# Patient Record
Sex: Male | Born: 1955 | Race: White | Hispanic: No | Marital: Single | Smoking: Never smoker
Health system: Southern US, Community
[De-identification: ages and names within clinical notes are randomized; demographics above are authoritative.]

## PROBLEM LIST (undated history)

## (undated) DIAGNOSIS — K5730 Diverticulosis of large intestine without perforation or abscess without bleeding: Secondary | ICD-10-CM

## (undated) DIAGNOSIS — I10 Essential (primary) hypertension: Secondary | ICD-10-CM

## (undated) DIAGNOSIS — E7800 Pure hypercholesterolemia, unspecified: Secondary | ICD-10-CM

## (undated) HISTORY — PX: ABDOMINAL SURGERY: SHX537

---

## 2004-05-25 ENCOUNTER — Inpatient Hospital Stay (HOSPITAL_COMMUNITY): Admission: EM | Admit: 2004-05-25 | Discharge: 2004-06-03 | Payer: Self-pay | Admitting: Emergency Medicine

## 2004-08-07 ENCOUNTER — Encounter: Admission: RE | Admit: 2004-08-07 | Discharge: 2004-08-07 | Payer: Self-pay | Admitting: General Surgery

## 2004-09-25 ENCOUNTER — Inpatient Hospital Stay (HOSPITAL_COMMUNITY): Admission: RE | Admit: 2004-09-25 | Discharge: 2004-09-30 | Payer: Self-pay | Admitting: General Surgery

## 2015-06-17 ENCOUNTER — Encounter (HOSPITAL_COMMUNITY): Payer: Self-pay | Admitting: Emergency Medicine

## 2015-06-17 ENCOUNTER — Emergency Department (HOSPITAL_COMMUNITY): Payer: BLUE CROSS/BLUE SHIELD

## 2015-06-17 ENCOUNTER — Observation Stay (HOSPITAL_COMMUNITY)
Admission: EM | Admit: 2015-06-17 | Discharge: 2015-06-18 | Disposition: A | Discharge status: Home / Self Care | Payer: BLUE CROSS/BLUE SHIELD | Attending: Internal Medicine | Admitting: Internal Medicine

## 2015-06-17 DIAGNOSIS — R739 Hyperglycemia, unspecified: Secondary | ICD-10-CM | POA: Diagnosis present

## 2015-06-17 DIAGNOSIS — W19XXXA Unspecified fall, initial encounter: Secondary | ICD-10-CM | POA: Diagnosis not present

## 2015-06-17 DIAGNOSIS — R42 Dizziness and giddiness: Secondary | ICD-10-CM | POA: Diagnosis present

## 2015-06-17 DIAGNOSIS — Z880 Allergy status to penicillin: Secondary | ICD-10-CM | POA: Insufficient documentation

## 2015-06-17 DIAGNOSIS — K5730 Diverticulosis of large intestine without perforation or abscess without bleeding: Secondary | ICD-10-CM | POA: Insufficient documentation

## 2015-06-17 DIAGNOSIS — S5001XA Contusion of right elbow, initial encounter: Secondary | ICD-10-CM | POA: Diagnosis not present

## 2015-06-17 DIAGNOSIS — E785 Hyperlipidemia, unspecified: Secondary | ICD-10-CM | POA: Diagnosis not present

## 2015-06-17 DIAGNOSIS — S0003XA Contusion of scalp, initial encounter: Secondary | ICD-10-CM

## 2015-06-17 DIAGNOSIS — I10 Essential (primary) hypertension: Secondary | ICD-10-CM | POA: Diagnosis present

## 2015-06-17 DIAGNOSIS — Z885 Allergy status to narcotic agent status: Secondary | ICD-10-CM | POA: Insufficient documentation

## 2015-06-17 DIAGNOSIS — R55 Syncope and collapse: Principal | ICD-10-CM | POA: Diagnosis present

## 2015-06-17 DIAGNOSIS — G4733 Obstructive sleep apnea (adult) (pediatric): Secondary | ICD-10-CM | POA: Diagnosis not present

## 2015-06-17 DIAGNOSIS — K219 Gastro-esophageal reflux disease without esophagitis: Secondary | ICD-10-CM | POA: Diagnosis present

## 2015-06-17 DIAGNOSIS — Z9989 Dependence on other enabling machines and devices: Secondary | ICD-10-CM

## 2015-06-17 HISTORY — DX: Essential (primary) hypertension: I10

## 2015-06-17 HISTORY — DX: Pure hypercholesterolemia, unspecified: E78.00

## 2015-06-17 HISTORY — DX: Diverticulosis of large intestine without perforation or abscess without bleeding: K57.30

## 2015-06-17 LAB — COMPREHENSIVE METABOLIC PANEL
ALT: 25 U/L (ref 17–63)
AST: 27 U/L (ref 15–41)
Albumin: 4.1 g/dL (ref 3.5–5.0)
Alkaline Phosphatase: 74 U/L (ref 38–126)
Anion gap: 12 (ref 5–15)
BUN: 21 mg/dL — ABNORMAL HIGH (ref 6–20)
CO2: 22 mmol/L (ref 22–32)
Calcium: 9 mg/dL (ref 8.9–10.3)
Chloride: 103 mmol/L (ref 101–111)
Creatinine, Ser: 1.2 mg/dL (ref 0.61–1.24)
GFR calc Af Amer: >60 mL/min (ref >60)
GFR calc non Af Amer: >60 mL/min (ref >60)
Glucose, Bld: 171 mg/dL — ABNORMAL HIGH (ref 65–99)
Potassium: 3.5 mmol/L (ref 3.5–5.1)
Sodium: 137 mmol/L (ref 135–145)
Total Bilirubin: 0.8 mg/dL (ref 0.3–1.2)
Total Protein: 6.4 g/dL — ABNORMAL LOW (ref 6.5–8.1)

## 2015-06-17 LAB — RAPID URINE DRUG SCREEN, HOSP PERFORMED
Amphetamines: NOT DETECTED
Barbiturates: NOT DETECTED
Benzodiazepines: NOT DETECTED
Cocaine: NOT DETECTED
Opiates: NOT DETECTED
Tetrahydrocannabinol: NOT DETECTED

## 2015-06-17 LAB — CBC WITH DIFFERENTIAL/PLATELET
Basophils Absolute: 0.1 10*3/uL (ref 0.0–0.1)
Basophils Relative: 1 % (ref 0–1)
Eosinophils Absolute: 0.4 10*3/uL (ref 0.0–0.7)
Eosinophils Relative: 4 % (ref 0–5)
HCT: 44.2 % (ref 39.0–52.0)
Hemoglobin: 15.2 g/dL (ref 13.0–17.0)
Lymphocytes Relative: 30 % (ref 12–46)
Lymphs Abs: 3 10*3/uL (ref 0.7–4.0)
MCH: 31.3 pg (ref 26.0–34.0)
MCHC: 34.4 g/dL (ref 30.0–36.0)
MCV: 90.9 fL (ref 78.0–100.0)
Monocytes Absolute: 0.7 10*3/uL (ref 0.1–1.0)
Monocytes Relative: 7 % (ref 3–12)
Neutro Abs: 5.9 10*3/uL (ref 1.7–7.7)
Neutrophils Relative %: 58 % (ref 43–77)
Platelets: 253 10*3/uL (ref 150–400)
RBC: 4.86 MIL/uL (ref 4.22–5.81)
RDW: 13.5 % (ref 11.5–15.5)
WBC: 10.1 10*3/uL (ref 4.0–10.5)

## 2015-06-17 LAB — I-STAT TROPONIN, ED: Troponin i, poc: 0 ng/mL (ref 0.00–0.08)

## 2015-06-17 LAB — GLUCOSE, CAPILLARY
Glucose-Capillary: 116 mg/dL — ABNORMAL HIGH (ref 65–99)
Glucose-Capillary: 111 mg/dL — ABNORMAL HIGH (ref 65–99)

## 2015-06-17 LAB — ETHANOL: Alcohol, Ethyl (B): <5 mg/dL (ref <5)

## 2015-06-17 MED ORDER — PROMETHAZINE HCL 25 MG PO TABS: 12.5000 mg | ORAL_TABLET | Freq: Four times a day (QID) | ORAL | Status: DC | PRN

## 2015-06-17 MED ORDER — SODIUM CHLORIDE 0.9 % IV SOLN
INTRAVENOUS | Status: AC
  Administered 2015-06-17: 13:00:00 via INTRAVENOUS

## 2015-06-17 MED ORDER — ASPIRIN EC 81 MG PO TBEC
81.0000 mg | DELAYED_RELEASE_TABLET | Freq: Every day | ORAL | Status: DC
  Administered 2015-06-17 – 2015-06-18 (×2): 81 mg via ORAL
  Filled 2015-06-17 (×2): qty 1

## 2015-06-17 MED ORDER — LISINOPRIL 20 MG PO TABS
20.0000 mg | ORAL_TABLET | Freq: Every day | ORAL | Status: DC
  Administered 2015-06-17 – 2015-06-18 (×2): 20 mg via ORAL
  Filled 2015-06-17 (×2): qty 1

## 2015-06-17 MED ORDER — NAPROXEN 500 MG PO TABS
500.0000 mg | ORAL_TABLET | Freq: Two times a day (BID) | ORAL | Status: DC | PRN
  Administered 2015-06-18: 500 mg via ORAL
  Filled 2015-06-17 (×3): qty 1

## 2015-06-17 MED ORDER — ENOXAPARIN SODIUM 40 MG/0.4ML ~~LOC~~ SOLN
40.0000 mg | SUBCUTANEOUS | Status: DC
  Administered 2015-06-17: 40 mg via SUBCUTANEOUS
  Filled 2015-06-17: qty 0.4

## 2015-06-17 MED ORDER — ATORVASTATIN CALCIUM 80 MG PO TABS
80.0000 mg | ORAL_TABLET | Freq: Every day | ORAL | Status: DC
  Administered 2015-06-17: 80 mg via ORAL
  Filled 2015-06-17: qty 1

## 2015-06-17 MED ORDER — PANTOPRAZOLE SODIUM 40 MG PO TBEC
40.0000 mg | DELAYED_RELEASE_TABLET | Freq: Every day | ORAL | Status: DC | PRN
  Administered 2015-06-18: 40 mg via ORAL
  Filled 2015-06-17: qty 1

## 2015-06-17 NOTE — Evaluation (Signed)
Physical Therapy Evaluation Patient Details Name: Nathan Acevedo MRN: [id number] DOB: 05-19-1956 Today's Date: 06/17/2015   History of Present Illness  Patient is a 59 yo male admitted 06/17/15 following syncope/collapse at work.  Patient hit head and elbow, and had LOC.   PMH:  HTN, OSA on CPAP, chronic back pain, h/o "dizziness" per patient  Clinical Impression  Patient presents with problems listed below.  Will benefit from acute PT to maximize functional independence prior to discharge.  Patient with symptoms of dizziness/nausea with position changes.  Initiated vestibular evaluation - patient requested to stop due to nausea.  Will return in am to continue assessment.  Recommend OP PT for Vestibular Rehab at discharge.    Follow Up Recommendations Outpatient PT (for Vestibular Rehab); Supervision - Intermittent    Equipment Recommendations  None recommended by PT    Recommendations for Other Services       Precautions / Restrictions Precautions Precautions: None Precaution Comments: dizziness Restrictions Weight Bearing Restrictions: No      Mobility  Bed Mobility Overal bed mobility: Modified Independent             General bed mobility comments: Increased time  Transfers Overall transfer level: Needs assistance Equipment used: None Transfers: Sit to/from Stand Sit to Stand: Supervision         General transfer comment: Supervision for safety only.  Ambulation/Gait Ambulation/Gait assistance: Supervision Ambulation Distance (Feet): 30 Feet Assistive device: None Gait Pattern/deviations: Step-through pattern     General Gait Details: Patient with good gait pattern.  Gait is slow and guarded.  Stairs            Wheelchair Mobility    Modified Rankin (Stroke Patients Only)       Balance Overall balance assessment: No apparent balance deficits (not formally assessed)                                           Pertinent  Vitals/Pain Pain Assessment: 0-10 Pain Score: 3  Pain Location: Rt chest wall Pain Descriptors / Indicators: Sore Pain Intervention(s): Monitored during session;Repositioned    Home Living Family/patient expects to be discharged to:: Private residence Living Arrangements: Alone Available Help at Discharge: Family;Available 24 hours/day (Patient could go to mother's home) Type of Home: House Home Access: Stairs to enter Entrance Stairs-Rails: Doctor, general practice of Steps: 3 Home Layout: Two level;Bed/bath upstairs Home Equipment: None      Prior Function Level of Independence: Independent         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  Performed Lt Dix-Hallpike - no symptoms.  On return to sitting, patient with 8/10 symptoms of dizziness and nausea with loss of balance.  Patient declined further testing due to nausea.    Exercises  Assessment/Plan    PT Assessment Patient needs continued PT services  PT Diagnosis Acute pain;Other (comment) (Dizziness)   PT Problem List Decreased activity tolerance;Decreased balance;Decreased mobility;Pain (Dizziness)  PT Treatment Interventions Gait training;Stair training;Functional mobility training;Therapeutic activities;Therapeutic exercise;Patient/family education (Vestibular Rehab)   PT Goals (Current goals can be found in the Care Plan section) Acute Rehab PT Goals Patient  Stated Goal: To go home soon PT Goal Formulation: With patient Time For Goal Achievement: 06/24/15 Potential to Achieve Goals: Good    Frequency Min 4X/week   Barriers to discharge Decreased caregiver support Patient lives alone.  Can go to mother's home at d/c for 24 hour assist.    Co-evaluation               End of Session   Activity Tolerance: Other (comment) (Limited by dizziness and nausea) Patient left: in bed;with call bell/phone within reach;with bed alarm set;with family/visitor present Nurse Communication: Mobility status (Vestibular testing initiated)    Functional Assessment Tool Used: Clinical judgement Functional Limitation: Mobility: Walking and moving around Mobility: Walking and Moving Around Current Status (W0981): At least 1 percent but less than 20 percent impaired, limited or restricted Mobility: Walking and Moving Around Goal Status [phone]): 0 percent impaired, limited or restricted    Time: 1640-1705 PT Time Calculation (min) (ACUTE ONLY): 25 min   Charges:   PT Evaluation $Initial PT Evaluation Tier I: 1 Procedure PT Treatments $Therapeutic Activity: 8-22 mins   PT G Codes:   PT G-Codes **NOT FOR INPATIENT CLASS** Functional Assessment Tool Used: Clinical judgement Functional Limitation: Mobility: Walking and moving around Mobility: Walking and Moving Around Current Status (W2956): At least 1 percent but less than 20 percent impaired, limited or restricted Mobility: Walking and Moving Around Goal Status [phone]): 0 percent impaired, limited or restricted    Vena Austria 06/17/2015, 5:28 PM Durenda Hurt. Renaldo Fiddler, [facility] Acute Rehab Services Pager [phone]

## 2015-06-17 NOTE — H&P (Signed)
Date: 06/17/2015               Patient Name:  Nathan Acevedo MRN: [id number]  DOB: 1955/11/18 Age / Sex: 59 y.o., male   PCP: Vicente Males, PA           Medical Service: Internal Medicine Teaching Service         Attending Physician: Dr. Tyson Alias, MD    First Contact: Dr. Allena Katz, MD Pager: [phone]-  Second Contact: Dr. Carlynn Purl, MD Pager: [phone] (7AM-5PM Mon-Fri)       After Hours (After 5p/  First Contact Pager: [phone]  weekends / holidays): Second Contact Pager: [phone]    Most Recent Discharge Date:  09/30/04  Chief Complaint:  Chief Complaint  Patient presents with  . Loss of Consciousness       History of Present Illness:  Nathan Acevedo is a 59 y.o. male who has a PMH of  HTN, dyslipidemia, GERD, chronic back pain, OSA (wears CPAP), and diverticulitis with prior diverting colostomy (2005), presents to ED with a syncopal episode.  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  About 7:15AM he was walking across the floor at his work when he suddenly began feeling dizzy which he describes as the room swaying back and forth.  He also endorses some diaphoresis (also reports being hot in his work area) and says he felt sleepy.  He denies any CP, SOB, or nausea/vomiting.  He remembers feeling he may need to sit down and then hit the ground striking the back of his head and his right elbow.  The next thing he remembers is waking up to everyone holding him down telling him not to get up.  He reports "being out" for maybe a couple of minutes.  He then states he felt some SOB and slight nausea when he came around but thinks it is due to the other employees holding him down.  Denies any loss of bowel/bladder control, seizure activity, tongue biting or confusion.  Denies any h/o seizure disorder, illicit drug/ETOH use.  No personal or FH of CAD or CVA.  Reports that he does feel dizzy with head movement.  Has experienced  similar episodes of dizziness since he was a child but never syncope.  Also states he has anxiety attacks about twice per year and that this feels similar to his previous attacks minus the syncope.  He reports having some difficulty with his supervisor at work and has felt somewhat anxious about that.  Denies any focal weakness.    ED course: In the ED, CT head was neg for acute findings but noted mild atrophy and periventricular white matter decreased attenuation likely due to chronic small vessel ischemic changes.  Noted focal scalp swelling in the left posterior parietal region.  CXR without acute findings. XR if R elbow revealed mild degenerative change without acute abnormality and soft tissue swelling over the olecranon c/w recent injury.  He was also borderline orthostatic.  EKG NSR.  I-stat troponin 0.00.  Serum glucose elevated at 171 and BUN slightly elevated at 21.    Meds: No current facility-administered medications for this encounter.   Current Outpatient Prescriptions  Medication Sig Dispense Refill  . lisinopril-hydrochlorothiazide (PRINZIDE,ZESTORETIC) 20-25 MG per tablet Take 1 tablet by mouth daily.    . naproxen (NAPROSYN) 500 MG tablet Take 500 mg by mouth 2 (two) times daily with a meal.    .  omeprazole (PRILOSEC) 20 MG capsule Take 20 mg by mouth daily as needed (heartburn).       (Not in a hospital admission)  Allergies: Allergies as of 06/17/2015 - Review Complete 06/17/2015  Allergen Reaction Noted  . Oxycodone  06/17/2015  . Penicillins Rash 06/17/2015    PMH: Past Medical History  Diagnosis Date  . Diverticula of intestine   . High cholesterol   . Hypertension     PSH: Past Surgical History  Procedure Laterality Date  . Abdominal surgery      FH: No family history on file.  SH: Social History  Substance Use Topics  . Smoking status: Never Smoker   . Smokeless tobacco: Not on file  . Alcohol Use: No    Review of Systems: Pertinent items  are noted in HPI.  Physical Exam: BP 137/93 mmHg  Pulse 88  Temp(Src) 98 F (36.7 C) (Oral)  Resp 21  SpO2 94%  Physical Exam  Constitutional: Vital signs reviewed.  Patient is well-developed and well-nourished male in no acute distress and cooperative with exam.  Head: Normocephalic with slight swelling to the posterior scalp without any obvious abrasions.   Eyes: PERRL, EOMI, conjunctivae normal, No scleral icterus.  Neck: Supple, Trachea midline .  Cardiovascular: RRR, S1 normal, S2 normal, no MRG, pulses symmetric and intact bilaterally. Pulmonary/Chest: normal respiratory effort, CTAB, no wheezes, rales, or rhonchi Abdominal: Soft. Non-tender, non-distended, bowel sounds are normal.  Midline surgical scar and left-sided scar from prior colostomy.   Neurological: A&O x3, cranial nerve II-XII are intact, no focal motor deficits, moving all extremities.  UE and LE strength 5/5.  Skin: Warm, dry and intact. No rash, cyanosis, or clubbing.  Psychiatric: Normal mood and affect.    Lab results:  Basic Metabolic Panel:  Recent Labs  16/10/96 0841  NA 137  K 3.5  CL 103  CO2 22  GLUCOSE 171*  BUN 21*  CREATININE 1.20  CALCIUM 9.0    Calcium/Magnesium/Phosphorus:  Recent Labs Lab 06/17/15 0841  CALCIUM 9.0    Liver Function Tests:  Recent Labs  06/17/15 0841  AST 27  ALT 25  ALKPHOS 74  BILITOT 0.8  PROT 6.4*  ALBUMIN 4.1   No results for input(s): LIPASE, AMYLASE in the last 72 hours. No results for input(s): AMMONIA in the last 72 hours.  CBC: Lab Results  Component Value Date   WBC 10.1 06/17/2015   HGB 15.2 06/17/2015   HCT 44.2 06/17/2015   MCV 90.9 06/17/2015   PLT 253 06/17/2015   Cardiac Enzymes:  Recent Labs  06/17/15 0833  TROPIPOC 0.00   No results found for: CKTOTAL, CKMB, CKMBINDEX, TROPONINI  Urine Drug Screen: Drugs of Abuse:  No results found for: LABOPIA, COCAINSCRNUR, LABBENZ, AMPHETMU, THCU, LABBARB  Alcohol  Level:  Recent Labs  06/17/15 0842  ETH <5    Urinalysis: No results found for: COLORURINE, APPEARANCEUR, LABSPEC, PHURINE, GLUCOSEU, HGBUR, BILIRUBINUR, KETONESUR, PROTEINUR, UROBILINOGEN, NITRITE, LEUKOCYTESUR  Imaging results:  Dg Chest 2 View  06/17/2015   CLINICAL DATA:  Fall at work with chest pain, initial encounter  EXAM: CHEST - 2 VIEW  COMPARISON:  None.  FINDINGS: The heart size and mediastinal contours are within normal limits. Both lungs are clear. The visualized skeletal structures are unremarkable.  IMPRESSION: No active disease.   Electronically Signed   By: Alcide Clever M.D.   On: 06/17/2015 09:05   Dg Elbow Complete Right  06/17/2015   CLINICAL DATA:  Fall at  work with elbow pain, initial encounter  EXAM: RIGHT ELBOW - COMPLETE 3+ VIEW  COMPARISON:  None.  FINDINGS: Mild degenerative changes are noted in the elbow. No acute fracture or dislocation is seen. Soft tissue prominence is noted over the olecranon consistent with the recent injury. No joint effusion is seen.  IMPRESSION: Mild degenerative change without acute bony abnormality.  Soft tissue swelling over the olecranon consistent with the recent injury.   Electronically Signed   By: Alcide Clever M.D.   On: 06/17/2015 09:04   Ct Head Wo Contrast  06/17/2015   CLINICAL DATA:  Dizziness this morning at work, fall  EXAM: CT HEAD WITHOUT CONTRAST  TECHNIQUE: Contiguous axial images were obtained from the base of the skull through the vertex without intravenous contrast.  COMPARISON:  None.  FINDINGS: No skull fracture is noted. There is scalp swelling in left posterior parietal region. No intracranial hemorrhage, mass effect or midline shift. Mild cerebral atrophy. Mild periventricular white matter decreased attenuation probable due to chronic small vessel ischemic changes. No definite acute cortical infarction. No mass lesion is noted on this unenhanced scan.  IMPRESSION: No acute intracranial abnormality. Mild cerebral  atrophy. Mild periventricular white matter decreased attenuation probable due to chronic small vessel ischemic changes. Focal scalp swelling in left posterior parietal region.   Electronically Signed   By: Natasha Mead M.D.   On: 06/17/2015 09:25    EKG: EKG Interpretation  Date/Time:  Friday June 17 2015 08:03:12 EDT Ventricular Rate:  99 PR Interval:  164 QRS Duration: 96 QT Interval:  359 QTC Calculation: 461 R Axis:   81 Text Interpretation:  Sinus rhythm Confirmed by Rubin Payor  MD, Harrold Donath [phone]) on 06/17/2015 8:07:02 AM   Antibiotics: Antibiotics Given (last 72 hours)    None      Anti-infectives    None     Consults:     Assessment & Plan by Problem: Principal Problem:   Syncope and collapse Active Problems:   Hypertension   Dyslipidemia   Hyperglycemia   GERD (gastroesophageal reflux disease)   OSA on CPAP  Pt is a 26 YOM with PMH of HTN, dyslipidemia, GERD, chronic back pain admitted for syncope.    Syncope  Pt experienced syncope while walking at work.  Has experienced previous episodes of presyncope but without syncope.  No h/o seizures, no seizure like activity,  tongue biting, loss of bowel bladder control.  No cardiac history and without CP and normal EKG and troponin.  Likely vasovagal syncope.  Endorses prodrome of dizziness, diaphoresis.  He was borderline orthostatic with BP lying 131/91 HR 91, sitting 145/88 HR 99, standing 149/90 HR 101. Likely anxiety may also be contributing given that anxiety attacks in the past have felt similar.   -admit to telemetry under observation status -repeat orthostatics in AM  -NS@100ml /h  -PT eval  -UDS pending   Hypertension  Stable, on lisinopril HCTZ at home. -cont lisinopril 20mg  but will hold HCTZ for now given orthostasis   Dyslipidemia Reports recently started on a statin, possibly crestor? -atorvastatin 80mg    Hyperglycemia Reports being checked for DM in the recent past.  Attempted to obtain records  from primary physician but was asking for a release of records to be faxed over. -check HA1c -monitor cbgs   GERD  Pt on PPI at home. -protonix PRN   OSA -cont CPAP qhs  FEN  Fluids-NS 179ml/h x 10 hr Electrolytes-Replete PRN as necessary  Nutrition-Carb mod/HH    VTE  prophylaxis  lovenox 40mg  SQ qd  Disposition Disposition deferred at this time, awaiting improvement of current medical problems. Anticipated discharge in approximately 1-2 day(s).  Obtain records from PCP.    Emergency Contact Contact Information    Name Relation Home Work Mobile   Louin Relative [phone]        The patient does have a current PCP (No primary care provider on file, PCP: Vicente Males, PA) and does need an [facility] hospital follow-up appointment after discharge.  Signed Marrian Salvage, MD PGY-3, Internal Medicine Teaching Service 06/17/2015, 12:31 PM

## 2015-06-17 NOTE — ED Provider Notes (Signed)
CSN: [id number]     Arrival date & time 06/17/15  0757 History   First MD Initiated Contact with Patient 06/17/15 0800     Chief Complaint  Patient presents with  . Loss of Consciousness     (Consider location/radiation/quality/duration/timing/severity/associated sxs/prior Treatment) Patient is a 59 y.o. Acevedo presenting with syncope.  Loss of Consciousness Associated symptoms: nausea   Associated symptoms: no chest pain, no headaches, no shortness of breath, no vomiting and no weakness    patient states he was at work when began to feel lightheaded or the room spinning. He states he then woke up on the floor. Complaining some pain in his elbow and states he has had to. At this point he is nauseous and states he feels as if the room is sort of waving back and forth. No chest pain. No trouble breathing. States he felt a little tired the last couple days but otherwise fine. Patient said he is on anticoagulation but it sounds more antihypertensives. No abdominal pain. No shortness of breath. No confusion.  Past Medical History  Diagnosis Date  . Diverticula of intestine   . High cholesterol   . Hypertension    Past Surgical History  Procedure Laterality Date  . Abdominal surgery     History reviewed. No pertinent family history. Social History  Substance Use Topics  . Smoking status: Never Smoker   . Smokeless tobacco: None  . Alcohol Use: No    Review of Systems  Constitutional: Negative for activity change and appetite change.  Eyes: Negative for pain.  Respiratory: Negative for chest tightness and shortness of breath.   Cardiovascular: Positive for syncope. Negative for chest pain and leg swelling.  Gastrointestinal: Positive for nausea. Negative for vomiting, abdominal pain and diarrhea.  Genitourinary: Negative for flank pain.  Musculoskeletal: Negative for back pain and neck stiffness.  Skin: Negative for rash.  Neurological: Positive for syncope. Negative for weakness,  numbness and headaches.  Psychiatric/Behavioral: Negative for behavioral problems.      Allergies  Oxycodone and Penicillins  Home Medications   Prior to Admission medications   Medication Sig Start Date End Date Taking? Authorizing Provider  lisinopril-hydrochlorothiazide (PRINZIDE,ZESTORETIC) 20-25 MG per tablet Take 1 tablet by mouth daily.   Yes Historical Provider, MD  naproxen (NAPROSYN) 500 MG tablet Take 500 mg by mouth 2 (two) times daily with a meal.   Yes Historical Provider, MD  omeprazole (PRILOSEC) 20 MG capsule Take 20 mg by mouth daily as needed (heartburn).   Yes Historical Provider, MD   BP 137/93 mmHg  Pulse 88  Temp(Src) 97.9 F (36.6 C) (Oral)  Resp 20  Ht 5\' 6"  (1.676 m)  Wt 182 lb 12.8 oz (82.918 kg)  BMI 29.52 kg/m2  SpO2 98% Physical Exam  Constitutional: He is oriented to person, place, and time. He appears well-developed and well-nourished.  HENT:  Hematoma to left occipital area.  Cardiovascular: Normal rate and regular rhythm.   Pulmonary/Chest: Effort normal and breath sounds normal.  Abdominal: Soft. There is no tenderness.  Musculoskeletal: Normal range of motion. He exhibits tenderness.  Swelling over olecranon area of right elbow. Neurovascularly intact over right hand.  Neurological: He is alert and oriented to person, place, and time. No cranial nerve deficit. Coordination normal.  Finger-nose grossly intact heel-to-shin intact bilaterally.  Skin: Skin is warm.    ED Course  Procedures (including critical care time) Labs Review Labs Reviewed  COMPREHENSIVE METABOLIC PANEL - Abnormal; Notable for the following:  Glucose, Bld 171 (*)    BUN 21 (*)    Total Protein 6.4 (*)    All other components within normal limits  CBC WITH DIFFERENTIAL/PLATELET  ETHANOL  URINE RAPID DRUG SCREEN, HOSP PERFORMED  HEMOGLOBIN A1C  I-STAT TROPOININ, ED    Imaging Review Dg Chest 2 View  06/17/2015   CLINICAL DATA:  Fall at work with chest  pain, initial encounter  EXAM: CHEST - 2 VIEW  COMPARISON:  None.  FINDINGS: The heart size and mediastinal contours are within normal limits. Both lungs are clear. The visualized skeletal structures are unremarkable.  IMPRESSION: No active disease.   Electronically Signed   By: Alcide Clever M.D.   On: 06/17/2015 09:05   Dg Elbow Complete Right  06/17/2015   CLINICAL DATA:  Fall at work with elbow pain, initial encounter  EXAM: RIGHT ELBOW - COMPLETE 3+ VIEW  COMPARISON:  None.  FINDINGS: Mild degenerative changes are noted in the elbow. No acute fracture or dislocation is seen. Soft tissue prominence is noted over the olecranon consistent with the recent injury. No joint effusion is seen.  IMPRESSION: Mild degenerative change without acute bony abnormality.  Soft tissue swelling over the olecranon consistent with the recent injury.   Electronically Signed   By: Alcide Clever M.D.   On: 06/17/2015 09:04   Ct Head Wo Contrast  06/17/2015   CLINICAL DATA:  Dizziness this morning at work, fall  EXAM: CT HEAD WITHOUT CONTRAST  TECHNIQUE: Contiguous axial images were obtained from the base of the skull through the vertex without intravenous contrast.  COMPARISON:  None.  FINDINGS: No skull fracture is noted. There is scalp swelling in left posterior parietal region. No intracranial hemorrhage, mass effect or midline shift. Mild cerebral atrophy. Mild periventricular white matter decreased attenuation probable due to chronic small vessel ischemic changes. No definite acute cortical infarction. No mass lesion is noted on this unenhanced scan.  IMPRESSION: No acute intracranial abnormality. Mild cerebral atrophy. Mild periventricular white matter decreased attenuation probable due to chronic small vessel ischemic changes. Focal scalp swelling in left posterior parietal region.   Electronically Signed   By: Natasha Mead M.D.   On: 06/17/2015 09:25   I have personally reviewed and evaluated these images and lab results  as part of my medical decision-making.   EKG Interpretation   Date/Time:  Friday June 17 2015 08:03:12 EDT Ventricular Rate:  99 PR Interval:  164 QRS Duration: 96 QT Interval:  359 QTC Calculation: 461 R Axis:   81 Text Interpretation:  Sinus rhythm Confirmed by Rubin Payor  MD, Dory Demont  [phone]) on 06/17/2015 8:07:02 AM      MDM   Final diagnoses:  Syncope, unspecified syncope type  Vertigo  Elbow contusion, right, initial encounter  Scalp hematoma, initial encounter    Patient with syncope. Had also episode of vertigo. Unsure if the syncope was from hitting his head or was the cause of the fall. Head CT reassuring. Will admit to internal medicine.    Benjiman Core, MD 06/17/15 [phone]

## 2015-06-17 NOTE — ED Notes (Signed)
Pt reports blood thinners , but no blood thinners found in daily medication.

## 2015-06-17 NOTE — Progress Notes (Signed)
  Pt admitted to the unit. Pt is stable, alert and oriented per baseline. Oriented to room, staff, and call bell. Educated to call for any assistance. Bed in lowest position, call bell within reach- will continue to monitor. 

## 2015-06-17 NOTE — ED Notes (Signed)
Per EMS pt was at work walking when he had syncopal episode. Pt fell to ground and hit back of head and rt elbow. Pt is on blood thinners. Pt alert x4. NAD at this time. Pt does report dizziness and one episode of vomiting in route.

## 2015-06-18 DIAGNOSIS — R55 Syncope and collapse: Secondary | ICD-10-CM | POA: Insufficient documentation

## 2015-06-18 DIAGNOSIS — R42 Dizziness and giddiness: Secondary | ICD-10-CM | POA: Insufficient documentation

## 2015-06-18 LAB — HEMOGLOBIN A1C
HEMOGLOBIN A1C: 5.9 % — AB (ref 4.8–5.6)
MEAN PLASMA GLUCOSE: 123 mg/dL

## 2015-06-18 LAB — GLUCOSE, CAPILLARY
GLUCOSE-CAPILLARY: 117 mg/dL — AB (ref 65–99)
Glucose-Capillary: 109 mg/dL — ABNORMAL HIGH (ref 65–99)

## 2015-06-18 NOTE — Discharge Instructions (Signed)
It was a pleasure to meet you Nathan Acevedo. We believe your episode that brought you to the hospital was likely due to a combination of dehydration and drop in blood pressure as your body was adjusting to restarting your medication.  We do not think that this was caused by a heart condition or seizure-like activity.  We have also ordered vestibular rehab which may be helpful for your dizziness.  Please schedule a follow up appointment with your regular doctor to assess for any changes that need to be made in your medical management.   Vasovagal Syncope, Adult Syncope, commonly known as fainting, is a temporary loss of consciousness. It occurs when the blood flow to the brain is reduced. Vasovagal syncope (also called neurocardiogenic syncope) is a fainting spell in which the blood flow to the brain is reduced because of a sudden drop in heart rate and blood pressure. Vasovagal syncope occurs when the brain and the cardiovascular system (blood vessels) do not adequately communicate and respond to each other. This is the most common cause of fainting. It often occurs in response to fear or some other type of emotional or physical stress. The body has a reaction in which the heart starts beating too slowly or the blood vessels expand, reducing blood pressure. This type of fainting spell is generally considered harmless. However, injuries can occur if a person takes a sudden fall during a fainting spell.  CAUSES  Vasovagal syncope occurs when a person's blood pressure and heart rate decrease suddenly, usually in response to a trigger. Many things and situations can trigger an episode. Some of these include:   Pain.   Fear.   The sight of blood or medical procedures, such as blood being drawn from a vein.   Common activities, such as coughing, swallowing, stretching, or going to the bathroom.   Emotional stress.   Prolonged standing, especially in a warm environment.   Lack of sleep or  rest.   Prolonged lack of food.   Prolonged lack of fluids.   Recent illness.  The use of certain drugs that affect blood pressure, such as cocaine, alcohol, marijuana, inhalants, and opiates.  SYMPTOMS  Before the fainting episode, you may:   Feel dizzy or light headed.   Become pale.  Sense that you are going to faint.   Feel like the room is spinning.   Have tunnel vision, only seeing directly in front of you.   Feel sick to your stomach (nauseous).   See spots or slowly lose vision.   Hear ringing in your ears.   Have a headache.   Feel warm and sweaty.   Feel a sensation of pins and needles. During the fainting spell, you will generally be unconscious for no longer than a couple minutes before waking up and returning to normal. If you get up too quickly before your body can recover, you may faint again. Some twitching or jerky movements may occur during the fainting spell.  DIAGNOSIS  Your caregiver will ask about your symptoms, take a medical history, and perform a physical exam. Various tests may be done to rule out other causes of fainting. These may include blood tests and tests to check the heart, such as electrocardiography, echocardiography, and possibly an electrophysiology study. When other causes have been ruled out, a test may be done to check the body's response to changes in position (tilt table test). TREATMENT  Most cases of vasovagal syncope do not require treatment. Your caregiver may recommend  ways to avoid fainting triggers and may provide home strategies for preventing fainting. If you must be exposed to a possible trigger, you can drink additional fluids to help reduce your chances of having an episode of vasovagal syncope. If you have warning signs of an oncoming episode, you can respond by positioning yourself favorably (lying down). If your fainting spells continue, you may be given medicines to prevent fainting. Some medicines may help  make you more resistant to repeated episodes of vasovagal syncope. Special exercises or compression stockings may be recommended. In rare cases, the surgical placement of a pacemaker is considered. HOME CARE INSTRUCTIONS   Learn to identify the warning signs of vasovagal syncope.   Sit or lie down at the first warning sign of a fainting spell. If sitting, put your head down between your legs. If you lie down, swing your legs up in the air to increase blood flow to the brain.   Avoid hot tubs and saunas.  Avoid prolonged standing.  Drink enough fluids to keep your urine clear or pale yellow. Avoid caffeine.  Increase salt in your diet as directed by your caregiver.   If you have to stand for a long time, perform movements such as:   Crossing your legs.   Flexing and stretching your leg muscles.   Squatting.   Moving your legs.   Bending over.   Only take over-the-counter or prescription medicines as directed by your caregiver. Do not suddenly stop any medicines without asking your caregiver first. SEEK MEDICAL CARE IF:   Your fainting spells continue or happen more frequently in spite of treatment.   You lose consciousness for more than a couple minutes.  You have fainting spells during or after exercising or after being startled.   You have new symptoms that occur with the fainting spells, such as:   Shortness of breath.  Chest pain.   Irregular heartbeat.   You have episodes of twitching or jerky movements that last longer than a few seconds.  You have episodes of twitching or jerky movements without obvious fainting. SEEK IMMEDIATE MEDICAL CARE IF:   You have injuries or bleeding after a fainting spell.   You have episodes of twitching or jerky movements that last longer than 5 minutes.   You have more than one spell of twitching or jerky movements before returning to consciousness after fainting. MAKE SURE YOU:   Understand these  instructions.  Will watch your condition.  Will get help right away if you are not doing well or get worse. Document Released: 09/24/2012 Document Reviewed: 09/24/2012 [facility] Patient Information 2015 [place], [place]. This information is not intended to replace advice given to you by your health care provider. Make sure you discuss any questions you have with your health care provider.

## 2015-06-18 NOTE — Care Management Note (Signed)
Case Management Note  Patient Details  Name: Nathan Acevedo MRN: [id number] Date of Birth: [birth year]  Subjective/Objective:                  Syncope and Collapse  Action/Plan: Discharge planning  Expected Discharge Date:  06/18/15               Expected Discharge Plan:  Home/Self Care  In-House Referral:     Discharge planning Services  CM Consult  Post Acute Care Choice:    Choice offered to:     DME Arranged:    DME Agency:     HH Arranged:    HH Agency:     Status of Service:  Completed, signed off  Medicare Important Message Given:    Date Medicare IM Given:    Medicare IM give by:    Date Additional Medicare IM Given:    Additional Medicare Important Message give by:     If discussed at [place] of Stay Meetings, dates discussed:    Additional Comments: CM met with pt who requests [facility] on [address].  Pt understands the [facility] will call on Monday to schedule an appt for vestibular rehab.  CM faxed facesheet, order, PT EVAL note, H&P, and DC summary to [facility] [place]. No other CM needs were communicated. Dellie Catholic, RN 06/18/2015, 4:03 PM

## 2015-06-18 NOTE — Progress Notes (Signed)
   Subjective: Patient states he is feeling "100" times better than yesterday. He does endorse some lightheadedness/dizziness when he sits up too quickly. He informs [place] that prior to his syncopal episode yesterday, he had discontinued his antihypertensive medication for about 2 weeks and restarted only several days before his "blackout."  Objective: Vital signs in last 24 hours: Filed Vitals:   06/17/15 2306 06/18/15 0217 06/18/15 0632 06/18/15 0900  BP:  136/89 134/84   Pulse: 94 94 79   Temp:  98 F (36.7 C) 98.3 F (36.8 C) 97.6 F (36.4 C)  TempSrc:  Oral Oral Oral  Resp: Height:      Weight:   184 lb 11.2 oz (83.779 kg)   SpO2: 97% 98% 95%    Weight change:   Intake/Output Summary (Last 24 hours) at 06/18/15 1059 Last data filed at 06/18/15 0600  Gross per 24 hour  Intake   1470 ml  Output      0 ml  Net   1470 ml   General: resting in bed Cardiac: RRR, no rubs, murmurs or gallops Pulm: clear to auscultation bilaterally, moving normal volumes of air Abd: soft, nontender, nondistended Ext: warm and well perfused, no pedal edema Neuro: alert and oriented X3  Assessment/Plan: Principal Problem:   Syncope and collapse Active Problems:   Hypertension   Dyslipidemia   Hyperglycemia   GERD (gastroesophageal reflux disease)   OSA on CPAP  Syncope: Patient's syncope with prodrome of dizziness/diaphoresis seems more likely to be a vasovagal process rather than cardiogenic or seizure activity. It was likely precipitated by dehydration as well as restarting his lisinopril-HCTZ after being off for about 2 weeks. Orthostatics this morning were normal. -vestibular rehab   Dispo: Disposition is deferred at this time, awaiting improvement of current medical problems.  Anticipated discharge today.  The patient does have a current PCP ([facility]) and does not need an [facility] hospital follow-up appointment after discharge.  The patient  does not have transportation limitations that hinder transportation to clinic appointments.      Darreld Mclean, MD 06/18/2015, 10:59 AM

## 2015-06-18 NOTE — Discharge Summary (Signed)
Name: Nathan Acevedo MRN: [id number] DOB: 1956/04/27 59 y.o. PCP: [facility]  Date of Admission: 06/17/2015  7:57 AM Date of Discharge: 06/18/2015 Attending Physician: Aletta Edouard, MD  Discharge Diagnosis: 1. Syncope and collapse  Principal Problem:   Syncope and collapse Active Problems:   Hypertension   Dyslipidemia   Hyperglycemia   GERD (gastroesophageal reflux disease)   OSA on CPAP   Faintness   Vertigo  Discharge Medications:   Medication List    TAKE these medications        lisinopril-hydrochlorothiazide 20-25 MG per tablet  Commonly known as:  PRINZIDE,ZESTORETIC  Take 1 tablet by mouth daily.     naproxen 500 MG tablet  Commonly known as:  NAPROSYN  Take 500 mg by mouth 2 (two) times daily with a meal.     omeprazole 20 MG capsule  Commonly known as:  PRILOSEC  Take 20 mg by mouth daily as needed (heartburn).        Disposition and follow-up:   Mr.Nathan Acevedo was discharged from [facility] in Good condition.  At the hospital follow up visit please address:  1.  Syncope: Patient had syncope in setting of restarting antihypertensive medication, possible dehydration, and prodromal symptoms of dizziness/lightheadedness and diaphoresis in a hot environment. Please follow up on any recurrent symptoms. Patient also to have vestibular rehab outpatient.  2.  Labs / imaging needed at time of follow-up: none  3.  Pending labs/ test needing follow-up: none  Follow-up Appointments: Follow-up Information    Follow up with [facility]. Schedule an appointment as soon as possible for a visit in 2 weeks.   Specialty:  Family Medicine      Discharge Instructions: Discharge Instructions    Call MD for:  difficulty breathing, headache or visual disturbances    Complete by:  As directed      Call MD for:  extreme fatigue    Complete by:  As directed      Call MD for:   persistant dizziness or light-headedness    Complete by:  As directed      Call MD for:  persistant nausea and vomiting    Complete by:  As directed      Call MD for:  severe uncontrolled pain    Complete by:  As directed      Diet - low sodium heart healthy    Complete by:  As directed      Increase activity slowly    Complete by:  As directed            Consultations:    Procedures Performed:  Dg Chest 2 View  06/17/2015   CLINICAL DATA:  Fall at work with chest pain, initial encounter  EXAM: CHEST - 2 VIEW  COMPARISON:  None.  FINDINGS: The heart size and mediastinal contours are within normal limits. Both lungs are clear. The visualized skeletal structures are unremarkable.  IMPRESSION: No active disease.   Electronically Signed   By: Alcide Clever M.D.   On: 06/17/2015 09:05   Dg Elbow Complete Right  06/17/2015   CLINICAL DATA:  Fall at work with elbow pain, initial encounter  EXAM: RIGHT ELBOW - COMPLETE 3+ VIEW  COMPARISON:  None.  FINDINGS: Mild degenerative changes are noted in the elbow. No acute fracture or dislocation is seen. Soft tissue prominence is noted over the olecranon consistent with the recent injury. No joint effusion  is seen.  IMPRESSION: Mild degenerative change without acute bony abnormality.  Soft tissue swelling over the olecranon consistent with the recent injury.   Electronically Signed   By: Alcide Clever M.D.   On: 06/17/2015 09:04   Ct Head Wo Contrast  06/17/2015   CLINICAL DATA:  Dizziness this morning at work, fall  EXAM: CT HEAD WITHOUT CONTRAST  TECHNIQUE: Contiguous axial images were obtained from the base of the skull through the vertex without intravenous contrast.  COMPARISON:  None.  FINDINGS: No skull fracture is noted. There is scalp swelling in left posterior parietal region. No intracranial hemorrhage, mass effect or midline shift. Mild cerebral atrophy. Mild periventricular white matter decreased attenuation probable due to chronic small vessel  ischemic changes. No definite acute cortical infarction. No mass lesion is noted on this unenhanced scan.  IMPRESSION: No acute intracranial abnormality. Mild cerebral atrophy. Mild periventricular white matter decreased attenuation probable due to chronic small vessel ischemic changes. Focal scalp swelling in left posterior parietal region.   Electronically Signed   By: Natasha Mead M.D.   On: 06/17/2015 09:25    2D Echo: n/a  Cardiac Cath: n/a  Admission HPI: Nathan Acevedo is a 59 y.o. male who has a PMH of HTN, dyslipidemia, GERD, chronic back pain, OSA (wears CPAP), and diverticulitis with prior diverting colostomy (2005), presents to ED with a syncopal episode. 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 About 7:15AM he was walking across the floor at his work when he suddenly began feeling dizzy which he describes as the room swaying back and forth. He also endorses some diaphoresis (also reports being hot in his work area) and says he felt sleepy. He denies any CP, SOB, or nausea/vomiting. He remembers feeling he may need to sit down and then hit the ground striking the back of his head and his right elbow. The next thing he remembers is waking up to everyone holding him down telling him not to get up. He reports "being out" for maybe a couple of minutes. He then states he felt some SOB and slight nausea when he came around but thinks it is due to the other employees holding him down. Denies any loss of bowel/bladder control, seizure activity, tongue biting or confusion. Denies any h/o seizure disorder, illicit drug/ETOH use. No personal or FH of CAD or CVA. Reports that he does feel dizzy with head movement. Has experienced similar episodes of dizziness since he was a child but never syncope. Also states he has anxiety attacks about twice per year and that this feels similar to his previous attacks minus the syncope. He reports  having some difficulty with his supervisor at work and has felt somewhat anxious about that. Denies any focal weakness.   ED course: In the ED, CT head was neg for acute findings but noted mild atrophy and periventricular white matter decreased attenuation likely due to chronic small vessel ischemic changes. Noted focal scalp swelling in the left posterior parietal region. CXR without acute findings. XR if R elbow revealed mild degenerative change without acute abnormality and soft tissue swelling over the olecranon c/w recent injury. He was also borderline orthostatic. EKG NSR. I-stat troponin 0.00. Serum glucose elevated at 171 and BUN slightly elevated at 21.   Hospital Course by problem list: Principal Problem:   Syncope and collapse Active Problems:   Hypertension   Dyslipidemia   Hyperglycemia   GERD (gastroesophageal  reflux disease)   OSA on CPAP   Faintness   Vertigo   Syncope: Patient had syncope in setting of restarting antihypertensive medication, possible dehydration, and prodromal symptoms of dizziness/lightheadedness and diaphoresis in a hot environment. Patient reports hitting his head and right elbow during this episode. Imaging workup revealed only soft tissue swelling of posterior head, no other acute pathology. His orthostatics were normal as well as cardiac and neuro exam. Given IV NS for rehydration. Patient was on telemetry overnight without concerning changes. Repeat orthostatics next morning remained normal. Patient's symptoms improved quickly. PT evaluation suggested vestibular rehab.  HTN: On lisinopril-HCTZ at home. Started on Lisinopril, held HCTZ. Blood pressures remained well-controlled.  GERD: Protonix prn  OSA: continued CPAP qhs    Discharge Vitals:   BP 134/84 mmHg  Pulse 79  Temp(Src) 97.6 F (36.4 C) (Oral)  Resp 18  Ht  (1.676 m)  Wt 184 lb 11.2 oz (83.779 kg)  BMI 29.83 kg/m2  SpO2 95%  Discharge Labs:  Results for orders  placed or performed during the hospital encounter of 06/17/15 (from the past 24 hour(s))  Glucose, capillary     Status: Abnormal   Collection Time: 06/17/15  5:35 PM  Result Value Ref Range   Glucose-Capillary 116 (H) 65 - 99 mg/dL   Comment 1 Notify RN   Glucose, capillary     Status: Abnormal   Collection Time: 06/17/15  9:33 PM  Result Value Ref Range   Glucose-Capillary 111 (H) 65 - 99 mg/dL  Glucose, capillary     Status: Abnormal   Collection Time: 06/18/15  6:30 AM  Result Value Ref Range   Glucose-Capillary 109 (H) 65 - 99 mg/dL  Glucose, capillary     Status: Abnormal   Collection Time: 06/18/15 11:20 AM  Result Value Ref Range   Glucose-Capillary 117 (H) 65 - 99 mg/dL   Comment 1 Notify RN    Comment 2 Document in Chart     Signed: Darreld Mclean, MD 06/18/2015, 1:39 PM    Services Ordered on Discharge: vestibular rehab Equipment Ordered on Discharge: none

## 2015-06-18 NOTE — Progress Notes (Signed)
Physical Therapy Treatment Patient Details Name: Nathan Acevedo MRN: [id number] DOB: August 31, 1956 Today's Date: 06/18/2015    History of Present Illness Patient is a 59 yo male admitted 06/17/15 following syncope/collapse at work.  Patient hit head and elbow, and had LOC.   PMH:  HTN, OSA on CPAP, chronic back pain, h/o "dizziness" per patient    PT Comments    Patient continues to have dizziness.  Patient tested positive for Rt posterior canal canalithiasis - performed Epley canalith repositioning maneuver to treat.  Continue to recommend OP PT for Vestibular Rehab at discharge.    Follow Up Recommendations  Supervision - Intermittent;Outpatient PT (for Vestibular Rehab)     Equipment Recommendations  None recommended by PT    Recommendations for Other Services       Precautions / Restrictions Precautions Precautions: None Precaution Comments: dizziness Restrictions Weight Bearing Restrictions: No    Mobility  Bed Mobility Overal bed mobility: Independent                Transfers Overall transfer level: Modified independent Equipment used: None Transfers: Sit to/from Stand Sit to Stand: Modified independent (Device/Increase time)         General transfer comment: Increased time for safety to ensure no dizziness.  Ambulation/Gait Ambulation/Gait assistance: Modified independent (Device/Increase time) Ambulation Distance (Feet): 32 Feet Assistive device: None Gait Pattern/deviations: Step-through pattern     General Gait Details: Good gait pattern and balance.  Slow for safety.   Stairs            Wheelchair Mobility    Modified Rankin (Stroke Patients Only)       Balance Overall balance assessment: No apparent balance deficits (not formally assessed)                                  Cognition Arousal/Alertness: Awake/alert Behavior During Therapy: WFL for tasks assessed/performed Overall Cognitive Status: Within  Functional Limits for tasks assessed                      Exercises      General Comments General comments (skin integrity, edema, etc.): Completed Supine Roll test - negative to both sides.  Performed Rt Dix-Hallpike which was positive for Rt posterior canal canalithiasis.  Performed Epley canalith repositioning maneuver to treat.      Pertinent Vitals/Pain Pain Assessment: No/denies pain    Home Living                      Prior Function            PT Goals (current goals can now be found in the care plan section) Progress towards PT goals: Progressing toward goals    Frequency  Min 4X/week    PT Plan Current plan remains appropriate    Co-evaluation             End of Session   Activity Tolerance: Patient tolerated treatment well (Limited due to dizziness) Patient left: in bed;with call bell/phone within reach     Time: 1414-1439 PT Time Calculation (min) (ACUTE ONLY): 25 min  Charges:  $Therapeutic Activity: 8-22 mins $Canalith Rep Proc: 8-22 mins                    G Codes:  Functional Assessment Tool Used: Clinical judgement Functional Limitation: Mobility: Walking and moving around Mobility: Walking and  Moving Around Goal Status [phone]): 0 percent impaired, limited or restricted Mobility: Walking and Moving Around Discharge Status [phone]): At least 1 percent but less than 20 percent impaired, limited or restricted   Vena Austria 06/18/2015, 2:55 PM Durenda Hurt. Renaldo Fiddler, [facility] Acute Rehab Services Pager [phone]

## 2015-08-04 ENCOUNTER — Ambulatory Visit: Payer: BLUE CROSS/BLUE SHIELD | Attending: Family Medicine

## 2015-08-04 DIAGNOSIS — R42 Dizziness and giddiness: Secondary | ICD-10-CM

## 2015-08-04 DIAGNOSIS — R2681 Unsteadiness on feet: Secondary | ICD-10-CM | POA: Diagnosis present

## 2015-08-04 NOTE — Therapy (Signed)
[facility] [address] [place], [place], [address] Phone: [phone]   Fax:  [phone]  Physical Therapy Evaluation  Patient Details  Name: Nathan Acevedo MRN: [id number] Date of Birth: 06-Nov-1955 Referring Provider:  Vicente Males, PA  Encounter Date: 08/04/2015      PT End of Session - 08/04/15 1632    Visit Number 1   Number of Visits 4   Date for PT Re-Evaluation 09/03/15   Authorization Type BCBS   PT Start Time 1533   PT Stop Time 1619   PT Time Calculation (min) 46 min   Activity Tolerance Patient tolerated treatment well   Behavior During Therapy [facility] for tasks assessed/performed      Past Medical History  Diagnosis Date  . Diverticula of intestine   . High cholesterol   . Hypertension     Past Surgical History  Procedure Laterality Date  . Abdominal surgery      There were no vitals filed for this visit.  Visit Diagnosis:  Dizziness and giddiness - Plan: PT plan of care cert/re-cert  Unsteadiness - Plan: PT plan of care cert/re-cert      Subjective Assessment - 08/04/15 1540    Subjective Pt reported he fell at work (06/17/15) due to a snycopal episode (per MD note, pt recently starting HTN meds). 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 Pt reports he had dizziness before but this is worse. Pt reports dizziness last about 10-20 seconds, at its worst dizziness is 6-7/10 and at best 0/10. Pt reports dizziness feels as though the room is rocking back and forth.    Pertinent History HTN   Patient Stated Goals Decrease dizziness   Currently in Pain? Yes   Pain Score 2    Pain Location Back   Pain Orientation Lower   Pain Descriptors / Indicators Aching   Pain Type Chronic pain   Pain Onset More than a month ago   Pain Frequency Intermittent   Aggravating Factors  lifting or bending    Pain  Relieving Factors resting/sitting            [facility] PT Assessment - 08/04/15 1545    Assessment   Medical Diagnosis Vertigo   Onset Date/Surgical Date 06/17/15   Hand Dominance Left   Prior Therapy none   Precautions   Precautions Fall   Restrictions   Weight Bearing Restrictions No   Balance Screen   Has the patient fallen in the past 6 months Yes   How many times? one  during syncopal episode   Has the patient had a decrease in activity level because of a fear of falling?  No   Is the patient reluctant to leave their home because of a fear of falling?  No   Home Environment   Living Environment Private residence   Living Arrangements Alone   Available Help at Discharge Other (Comment)   Type of Home House   Home Access Stairs to enter   Entrance Stairs-Number of Steps 3   Entrance Stairs-Rails None   Home Layout Two level   Alternate Level Stairs-Number of Steps 11  split level   Alternate Level Stairs-Rails Right   Home Equipment Grab bars - tub/shower   Prior Function   Level of Independence Independent   Vocation Full time employment   Corporate treasurer   Leisure  go to care races and restore antique engines    Cognition   Overall Cognitive Status Within Functional Limits for tasks assessed   Ambulation/Gait   Ambulation/Gait Yes   Ambulation/Gait Assistance 7: Independent   Ambulation Distance (Feet) 100 Feet   Assistive device None   Gait Pattern Within Functional Limits   Ambulation Surface Level;Indoor   Gait velocity 3.4938ft/sec            Vestibular Assessment - 08/04/15 0001    Symptom Behavior   Type of Dizziness --  "room is rocking from side to side"   Frequency of Dizziness Every day, especially when lying    Duration of Dizziness 10-20 seconds   Aggravating Factors Lying supine;Turning head quickly  looking up   Relieving Factors Rest   Occulomotor Exam   Occulomotor Alignment Normal   Spontaneous Absent    Gaze-induced Absent   Smooth Pursuits Comment  No dizziness but pt had difficulty following pen to L side   Saccades Slow  L side slow, R side WNL   Vestibulo-Occular Reflex   VOR 1 Head Only (x 1 viewing) No dizziness.   Comment No dizziness during head thrust and no saccades noted.   Positional Testing   Dix-Hallpike Dix-Hallpike Right;Dix-Hallpike Left   Horizontal Canal Testing Horizontal Canal Right;Horizontal Canal Left   Dix-Hallpike Right   Dix-Hallpike Right Duration 2/10 dizziness   Dix-Hallpike Right Symptoms No nystagmus   Dix-Hallpike Left   Dix-Hallpike Left Duration 30-45 seconds. Pt reported 5-6/10 dizziness.   Dix-Hallpike Left Symptoms Upbeat, right rotatory nystagmus   Horizontal Canal Right   Horizontal Canal Right Duration none   Horizontal Canal Right Symptoms Normal   Horizontal Canal Left   Horizontal Canal Left Duration none   Horizontal Canal Left Symptoms Normal   Positional Sensitivities   Supine to Sitting Mild dizziness                Vestibular Treatment/Exercise - 08/04/15 0001    Vestibular Treatment/Exercise   Vestibular Treatment Provided Canalith Repositioning   Canalith Repositioning Epley Manuever Left    EPLEY MANUEVER LEFT   Number of Reps  2   Overall Response  Improved Symptoms    RESPONSE DETAILS LEFT Pt reported 2/10 dizziness during first Epley's and 0/10 during second Epley's (in position one) but did report brief dizziness during position two (looking to R side) which quickly resolved and PT noted 2 beats of upbeating rotatory nystagmus. Pt reported 0/10 after entire second rep of Epley's.               PT Education - 08/04/15 1631    Education provided Yes   Education Details PT frequency/duration. PT discussed BPPV and treatment.   Person(s) Educated Patient   Methods Explanation;Demonstration   Comprehension Verbalized understanding          PT Short Term Goals - 08/04/15 1637    PT SHORT TERM GOAL  #1   Title same as LTGs.           PT Long Term Goals - 08/04/15 1637    PT LONG TERM GOAL #1   Title Pt will be IND in performing HEP to decr. dizziness and improve balance. Target date: 09/01/15.   Status New   PT LONG TERM GOAL #2   Title Pt will report 0/10 dizziness while lying down and looking up in order to perform ADLs and work duties in a safe manner. Target date: 09/01/15.   Status New  PT LONG TERM GOAL #3   Title Perform SOT and write goal as needed. Target date: 09/01/15.   Status New               Plan - 08/04/15 1632    Clinical Impression Statement Pt is a pleasant 59y/o male presenting to OPPT neuro with dizziness s/p fall. Pt's gait, endurance, and strength were WFL. Pt's symptoms and nystagmus consistent with R BPPV, and symptoms improved after Epley's treatment. PT will perform SOT to assess pt's balance next session and provide balance HEP as needed. Pt will benefit from skilled PT to decrease dizziness and provide HEP for gaze stabilization.   Pt will benefit from skilled therapeutic intervention in order to improve on the following deficits Dizziness;Postural dysfunction;Decreased balance;Decreased mobility   Rehab Potential Good   PT Frequency 1x / week   PT Duration 4 weeks   PT Treatment/Interventions ADLs/Self Care Home Management;Neuromuscular re-education;Canalith Repostioning;Vestibular;Manual techniques;Balance training;Therapeutic exercise;Therapeutic activities;Functional mobility training;Gait training   PT Next Visit Plan Reassess for L BPPV and treat as needed, perform SOT. Provide gaze stab. and balance HEP.   Consulted and Agree with Plan of Care Patient         Problem List Patient Active Problem List   Diagnosis Date Noted  . Faintness   . Vertigo   . Syncope and collapse 06/17/2015  . Hypertension 06/17/2015  . Dyslipidemia 06/17/2015  . Hyperglycemia 06/17/2015  . GERD (gastroesophageal reflux disease) 06/17/2015  . OSA  on CPAP 06/17/2015    Zakari Bathe L 08/04/2015, 4:45 PM  [facility] [address] [place], [place], [address] Phone: [phone]   Fax:  [phone]    Zerita Boers, PT,DPT 08/04/2015 4:45 PM Phone: [phone] Fax: [phone]

## 2015-08-19 ENCOUNTER — Ambulatory Visit: Payer: BLUE CROSS/BLUE SHIELD

## 2015-08-19 DIAGNOSIS — R2681 Unsteadiness on feet: Secondary | ICD-10-CM

## 2015-08-19 DIAGNOSIS — R42 Dizziness and giddiness: Secondary | ICD-10-CM

## 2015-08-19 NOTE — Patient Instructions (Signed)
Perform in corner with a chair in front of you for safety:  Feet Together (Compliant Surface) Varied Arm Positions - Eyes Closed    Stand on compliant surface: ___pillow_____ with feet together and arms out. Close eyes and visualize upright position. Hold__30__ seconds. Repeat __3__ times per session. Do __1__ sessions per day.  Copyright  VHI. All rights reserved.  Feet Together (Compliant Surface) Head Motion - Eyes Closed    Stand on compliant surface: __[pillow______ with feet together. Close eyes and move head slowly, up and down 10 times and side to side 10 times. Repeat __3__ times per session. Do __1__ sessions per day.  Copyright  VHI. All rights reserved.

## 2015-08-19 NOTE — Therapy (Signed)
[facility] [address] [place], [place], [address] Phone: [phone]   Fax:  [phone]  Physical Therapy Treatment  Patient Details  Name: Henrietta DineMarion T Locust MRN: [id number] Date of Birth: Jun 22, 1956 No Data Recorded  Encounter Date: 08/19/2015      PT End of Session - 08/19/15 1900    Visit Number 2   Number of Visits 4   Date for PT Re-Evaluation 09/03/15   Authorization Type BCBS   PT Start Time 1537   PT Stop Time 1620   PT Time Calculation (min) 43 min   Equipment Utilized During Treatment --  harness in SOT   Activity Tolerance Patient tolerated treatment well   Behavior During Therapy [facility] for tasks assessed/performed      Past Medical History  Diagnosis Date  . Diverticula of intestine   . High cholesterol   . Hypertension     Past Surgical History  Procedure Laterality Date  . Abdominal surgery      There were no vitals filed for this visit.  Visit Diagnosis:  Dizziness and giddiness  Unsteadiness      Subjective Assessment - 08/19/15 1540    Subjective Pt reported he felt better for about a week after initial treatment but then dizziness came back. Pt rated dizziness at 0/10 currently, but 6/10 at work on [employer].    Pertinent History HTN   Patient Stated Goals Decrease dizziness   Currently in Pain? No/denies          Neuro re-ed: Pt performed balance activities in corner with chair and min guard to supervision for safety. Cues for technique. Feet apart/together, eyes open/closed, with and without head turns; all on compliant and non-compliant surfaces. Performed for 10 reps each direction or 10-30 second holds, 1-2 reps/activity. Please see pt instructions for details.      Vestibular Assessment - 08/19/15 1541    Dix-Hallpike Right   Dix-Hallpike Right Duration 1/10 dizziness for 1-2 seconds.   Dix-Hallpike Right Symptoms No nystagmus   Dix-Hallpike Left   Dix-Hallpike  Left Duration 2/10 for approx. 20 seconds.   Dix-Hallpike Left Symptoms No nystagmus                  Vestibular Treatment/Exercise - 08/19/15 0001    Vestibular Treatment/Exercise   Vestibular Treatment Provided Canalith Repositioning   Canalith Repositioning Epley Manuever Left    EPLEY MANUEVER LEFT   Number of Reps  2   Overall Response  Improved Symptoms    RESPONSE DETAILS LEFT Pt reported no dizziness after second Epley treatment.      Neuro re-ed: sensory organization test performed with following results: Conditions: 1:  WFL 2:  WFL 3:  WFL 4: 2 trials WFL and one trial below normal limits 5: 2 falls and one trial below normal limits 6: WFL Composite score:  67 Sensory Analysis Som: WFL Vis: WFL Vest: Below normal at approx. 20 vs. Normal approx. 55. pref: Strategy analysis:      Good hip and ankle strategies, except for decr. Hip strategy during condition 5. COG alignment:      Anterior and to L side.            PT Education - 08/19/15 1859    Education provided Yes   Education Details Balance HEP provided and PT explained results of SOT.   Person(s) Educated Patient   Methods Explanation;Demonstration;Verbal cues;Handout   Comprehension Returned demonstration;Verbalized understanding  PT Short Term Goals - 08/04/15 1637    PT SHORT TERM GOAL #1   Title same as LTGs.           PT Long Term Goals - 08/19/15 1904    PT LONG TERM GOAL #1   Title Pt will be IND in performing HEP to decr. dizziness and improve balance. Target date: 09/01/15.   Status On-going   PT LONG TERM GOAL #2   Title Pt will report 0/10 dizziness while lying down and looking up in order to perform ADLs and work duties in a safe manner. Target date: 09/01/15.   Status On-going   PT LONG TERM GOAL #3   Title Perform SOT and write goal as needed. Target date: 09/01/15.   Status Achieved   PT LONG TERM GOAL #4   Title Pt will improve composite score to  WNL (70) to improve balance and vestibular input in order to reduce dizziness and perform work duties. Target date: 09/01/15.   Status New               Plan - 08/19/15 1900    Clinical Impression Statement Pt demonstrated progress as he reported dizziness was much better after treatment last session and today. Pt's SOT score was below normal and indicated decreased input from vestibular system. PT did not note nystagmus this session, and educated pt that now the focus needs to be on balance exercises to improve vestibular input. Also addendum to eval note, pt's nystagmus was L rotatory upbeating and not R upbeating, indicating L pBPPV. Continue with POC.   Pt will benefit from skilled therapeutic intervention in order to improve on the following deficits Dizziness;Postural dysfunction;Decreased balance;Decreased mobility   Rehab Potential Good   PT Frequency 1x / week   PT Duration 4 weeks   PT Treatment/Interventions ADLs/Self Care Home Management;Neuromuscular re-education;Canalith Repostioning;Vestibular;Manual techniques;Balance training;Therapeutic exercise;Therapeutic activities;Functional mobility training;Gait training   PT Next Visit Plan Dynamic balance/gait training. L Epley's prn. Add one more appt (I held 09/09/15, 3:30pm spot).   PT Home Exercise Plan balance HEP   Consulted and Agree with Plan of Care Patient        Problem List Patient Active Problem List   Diagnosis Date Noted  . Faintness   . Vertigo   . Syncope and collapse 06/17/2015  . Hypertension 06/17/2015  . Dyslipidemia 06/17/2015  . Hyperglycemia 06/17/2015  . GERD (gastroesophageal reflux disease) 06/17/2015  . OSA on CPAP 06/17/2015    Sharhonda Atwood L 08/19/2015, 7:06 PM  [facility] [address] [place], [place], [address] Phone: [phone]   Fax:  [phone]  Name: RIVALDO HINEMAN MRN: [id number] Date of Birth:  [birth year]    Zerita Boers, PT,DPT 08/19/2015 7:06 PM Phone: [phone] Fax: [phone]

## 2015-08-25 ENCOUNTER — Ambulatory Visit: Payer: BLUE CROSS/BLUE SHIELD | Attending: Family Medicine

## 2015-08-25 DIAGNOSIS — R42 Dizziness and giddiness: Secondary | ICD-10-CM | POA: Insufficient documentation

## 2015-08-25 DIAGNOSIS — R2681 Unsteadiness on feet: Secondary | ICD-10-CM | POA: Diagnosis present

## 2015-08-25 NOTE — Therapy (Signed)
[facility] [address] [place], [place], [address] Phone: [phone]   Fax:  [phone]  Physical Therapy Treatment  Patient Details  Name: Nathan Acevedo MRN: [id number] Date of Birth: [birth year] No Data Recorded  Encounter Date: 08/25/2015      PT End of Session - 08/25/15 1630    Visit Number 3   Number of Visits 4   Date for PT Re-Evaluation 09/03/15   Authorization Type BCBS   PT Start Time 1530   PT Stop Time 1613   PT Time Calculation (min) 43 min   Equipment Utilized During Treatment Gait belt   Activity Tolerance Patient tolerated treatment well   Behavior During Therapy [facility] for [facility] assessed/performed      Past Medical History  Diagnosis Date  . Diverticula of intestine   . High cholesterol   . Hypertension     Past Surgical History  Procedure Laterality Date  . Abdominal surgery      There were no vitals filed for this visit.  Visit Diagnosis:  Dizziness and giddiness  Unsteadiness      Subjective Assessment - 08/25/15 1532    Subjective Pt reports he still feels dizziness, and reports it is about 7/10 when on the creeper at work. Pt describes dizziness as more of a feeling of imbalance but reports spinning sensation has decreased. Pt reports he is still bumping into the wall  during first rep of balance HEP and less sway with each rep.   Pertinent History HTN   Patient Stated Goals Decrease dizziness   Currently in Pain? Yes   Pain Score 4    Pain Location Back   Pain Orientation Lower   Pain Descriptors / Indicators Aching   Pain Type Chronic pain   Pain Onset More than a month ago   Pain Frequency Intermittent   Aggravating Factors  working/movement   Pain Relieving Factors medication for inflammation                Vestibular Assessment - 08/25/15 0001    Dix-Hallpike Right   Dix-Hallpike Right Duration 2/10 for "a split second"   Dix-Hallpike Right Symptoms  No nystagmus   Dix-Hallpike Left   Dix-Hallpike Left Duration 0/10   Dix-Hallpike Left Symptoms No nystagmus   Horizontal Canal Right   Horizontal Canal Right Duration none   Horizontal Canal Right Symptoms Normal   Horizontal Canal Left   Horizontal Canal Left Duration none   Horizontal Canal Left Symptoms Normal                  Vestibular Treatment/Exercise - 08/25/15 0001    Vestibular Treatment/Exercise   Vestibular Treatment Provided Canalith Repositioning   Canalith Repositioning Epley Manuever Right    EPLEY MANUEVER RIGHT   Number of Reps  3   Overall Response Improved Symptoms   Response Details  Pt reported 2/10 dizziness in first position during first rep, 0/10 after first rep. Pt reported 6/10 dizziness after approx. 15 seconds in 1st position during second rep with R upbeating nystagmus. 0/10 after 2 second rep. Pt reported 4/10 dizziness during first position of 3 reps of Epley and 0/10 after last position.            Balance Exercises - 08/25/15 1625    Balance Exercises: Standing   Standing Eyes Opened Foam/compliant surface;Other reps (comment);Other (comment)  2x5 reps: marching B LEs on airex.   Rockerboard EC;Lateral;Head turns;EO;Anterior/posterior;30 seconds;10 reps  no  UE support   Other Standing Exercises Performed in // bars with no UE support and min guard to min A for safety and to maintain balance. X10/direction shifting and 30 second holds during static activities. Cues for technique and to contract TrA during B LE marches and to improve lat. wt. shifting during marches.           PT Education - 08/25/15 1628    Education provided Yes   Education Details PT educated pt on possibility that pt also has BPPV in R inner ear, as his sx's are consistent with R BPPV. Nystagmus is only a few beats and appears to be upbeating and rotatory. PT encouraged pt to continue balance HEP to improve vestibular input.   Person(s) Educated Patient    Methods Explanation   Comprehension Verbalized understanding          PT Short Term Goals - 08/04/15 1637    PT SHORT TERM GOAL #1   Title same as LTGs.           PT Long Term Goals - 08/25/15 1632    PT LONG TERM GOAL #1   Title Pt will be IND in performing HEP to decr. dizziness and improve balance. Target date: 09/01/15.   Status On-going   PT LONG TERM GOAL #2   Title Pt will report 0/10 dizziness while lying down and looking up in order to perform ADLs and work duties in a safe manner. Target date: 09/01/15.   Status On-going   PT LONG TERM GOAL #3   Title Perform SOT and write goal as needed. Target date: 09/01/15.   Status Achieved   PT LONG TERM GOAL #4   Title Pt will improve composite score to WNL (70) to improve balance and vestibular input in order to reduce dizziness and perform work duties. Target date: 09/01/15.   Status On-going               Plan - 08/25/15 1630    Clinical Impression Statement Pt's L BPPV appears to have cleared, as pt reported no dizziness and PT did not note any nystagmus during DIx-Hallpike (L side). However, pt reported dizziness during R Dix-Hallpike with 2 quick beats of nystagmus during Eplet treatment. Pt reported sx's decr. after 3 Epley treatments. These sx's appear to be consistent with R BPPV.  Pt continues to experience incr. postural sway and LOB when vestibular system is challenged. Continue with POC.   Pt will benefit from skilled therapeutic intervention in order to improve on the following deficits Dizziness;Postural dysfunction;Decreased balance;Decreased mobility   Rehab Potential Good   PT Frequency 1x / week   PT Duration 4 weeks   PT Treatment/Interventions ADLs/Self Care Home Management;Neuromuscular re-education;Canalith Repostioning;Vestibular;Manual techniques;Balance training;Therapeutic exercise;Therapeutic activities;Functional mobility training;Gait training   PT Next Visit Plan Check goals, assess  positional changes and vestibular training.   PT Home Exercise Plan balance HEP   Consulted and Agree with Plan of Care Patient        Problem List Patient Active Problem List   Diagnosis Date Noted  . Faintness   . Vertigo   . Syncope and collapse 06/17/2015  . Hypertension 06/17/2015  . Dyslipidemia 06/17/2015  . Hyperglycemia 06/17/2015  . GERD (gastroesophageal reflux disease) 06/17/2015  . OSA on CPAP 06/17/2015    Miller,Jennifer L 08/25/2015, 4:32 PM  [facility] [address] [place], [place], [address] Phone: [phone]   Fax:  [phone]  Name: Nathan Acevedo MRN: [id number] Date of Birth: 21-May-1956    Zerita Boers, PT,DPT 08/25/2015 4:32 PM Phone: [phone] Fax: [phone]

## 2015-09-02 ENCOUNTER — Ambulatory Visit: Payer: BLUE CROSS/BLUE SHIELD

## 2015-09-02 DIAGNOSIS — R2681 Unsteadiness on feet: Secondary | ICD-10-CM

## 2015-09-02 DIAGNOSIS — R42 Dizziness and giddiness: Secondary | ICD-10-CM | POA: Diagnosis not present

## 2015-09-02 NOTE — Therapy (Signed)
[facility] [address] [place], [place], [address] Phone: [phone]   Fax:  [phone]  Physical Therapy Treatment  Patient Details  Name: Nathan Acevedo MRN: [id number] Date of Birth: November 26, 1955 No Data Recorded  Encounter Date: 09/02/2015      PT End of Session - 09/02/15 1404    Visit Number 4   Number of Visits 4   Date for PT Re-Evaluation 09/03/15   Authorization Type [employer]   PT Start Time 1515   PT Stop Time 1554   PT Time Calculation (min) 39 min   Equipment Utilized During Treatment --  SOT harness   Activity Tolerance Patient tolerated treatment well   Behavior During Therapy [facility] for tasks assessed/performed      Past Medical History  Diagnosis Date  . Diverticula of intestine   . High cholesterol   . Hypertension     Past Surgical History  Procedure Laterality Date  . Abdominal surgery      There were no vitals filed for this visit.  Visit Diagnosis:  Dizziness and giddiness  Unsteadiness      Subjective Assessment - 09/02/15 1317    Subjective Pt reports dizziness has significantly decreased since last visit. Pt only experienced one episode of dizziness, for one brief second. Pt reported no dizziness at work, while lying supine on creeper. Pt reports he is taking a "natural supplement" to decrease inflammation in his joints.    Pertinent History HTN   Patient Stated Goals Decrease dizziness   Currently in Pain? No/denies                Vestibular Assessment - 09/02/15 1359    Dix-Hallpike Right   Dix-Hallpike Right Duration 0/10, no dizziness   Dix-Hallpike Right Symptoms No nystagmus   Dix-Hallpike Left   Dix-Hallpike Left Duration 0/10, no dizziness   Dix-Hallpike Left Symptoms No nystagmus       Neuro re-ed: sensory organization test performed with following results: Conditions: 1:  WNL  2:  One trial slightly below normal, two trials above normal  limits 3:  WNL 4: WNL 5: WNL 6: WNL Composite score:  82 Sensory Analysis Som: WNL Vis:WNL Vest: WNL (greatly improved as pt was at approx. 20 during first testing and now >75) Pref: WNL Strategy analysis:   Good balance of ankle/hip strategy during each condition    COG alignment:      Pt's COG now closer to midline during each condition vs. To the L and anterior during first testing.               Self Care:     PT Education - 09/02/15 1403    Education provided Yes   Education Details PT educated pt on goal progress and explained SOT results. PT educated pt on the importance of continuing to perform balance HEP to maintain gains made during PT. PT reviewed HEP with pt. PT also educated pt that he would require a new referral if vertigo comes back.   Person(s) Educated Patient   Methods Explanation   Comprehension Verbalized understanding          PT Short Term Goals - 08/04/15 1637    PT SHORT TERM GOAL #1   Title same as LTGs.           PT Long Term Goals - 09/02/15 1405    PT LONG TERM GOAL #1   Title Pt will be IND in performing HEP to  decr. dizziness and improve balance. Target date: 09/01/15.   Status Achieved   PT LONG TERM GOAL #2   Title Pt will report 0/10 dizziness while lying down and looking up in order to perform ADLs and work duties in a safe manner. Target date: 09/01/15.   Status Achieved   PT LONG TERM GOAL #3   Title Perform SOT and write goal as needed. Target date: 09/01/15.   Status Achieved   PT LONG TERM GOAL #4   Title Pt will improve composite score to WNL (70) to improve balance and vestibular input in order to reduce dizziness and perform work duties. Target date: 09/01/15.   Status Achieved               Plan - 09/02/15 1405    Clinical Impression Statement Pt met all LTGs, and is discharging based on progress. Please see d/c summary for details.        Problem List Patient Active Problem List   Diagnosis  Date Noted  . Faintness   . Vertigo   . Syncope and collapse 06/17/2015  . Hypertension 06/17/2015  . Dyslipidemia 06/17/2015  . Hyperglycemia 06/17/2015  . GERD (gastroesophageal reflux disease) 06/17/2015  . OSA on CPAP 06/17/2015    Cybele Maule L 09/02/2015, 2:06 PM  [facility] [address] [place], [place], [address] Phone: [phone]   Fax:  [phone]  Name: Nathan Acevedo MRN: [id number] Date of Birth: Mar 19, 1956  PHYSICAL THERAPY DISCHARGE SUMMARY  Visits from Start of Care: 4  Current functional level related to goals / functional outcomes:     PT Long Term Goals - 09/02/15 1405    PT LONG TERM GOAL #1   Title Pt will be IND in performing HEP to decr. dizziness and improve balance. Target date: 09/01/15.   Status Achieved   PT LONG TERM GOAL #2   Title Pt will report 0/10 dizziness while lying down and looking up in order to perform ADLs and work duties in a safe manner. Target date: 09/01/15.   Status Achieved   PT LONG TERM GOAL #3   Title Perform SOT and write goal as needed. Target date: 09/01/15.   Status Achieved   PT LONG TERM GOAL #4   Title Pt will improve composite score to WNL (70) to improve balance and vestibular input in order to reduce dizziness and perform work duties. Target date: 09/01/15.   Status Achieved        Remaining deficits: Pt reported only one brief episode of dizziness while standing, since last visit.    Education / Equipment: HEP  Plan: Patient agrees to discharge.  Patient goals were met. Patient is being discharged due to meeting the stated rehab goals.  ?????       Geoffry Paradise, PT,DPT 09/02/2015 2:06 PM Phone: [phone] Fax: [phone]

## 2016-11-03 IMAGING — DX DG CHEST 2V
2 series · 2 of 2 positions shown · non-contrast
Comparison: None.

CLINICAL DATA: Fall at work with chest pain, initial encounter

EXAM:
CHEST - 2 VIEW

[w chest pa]
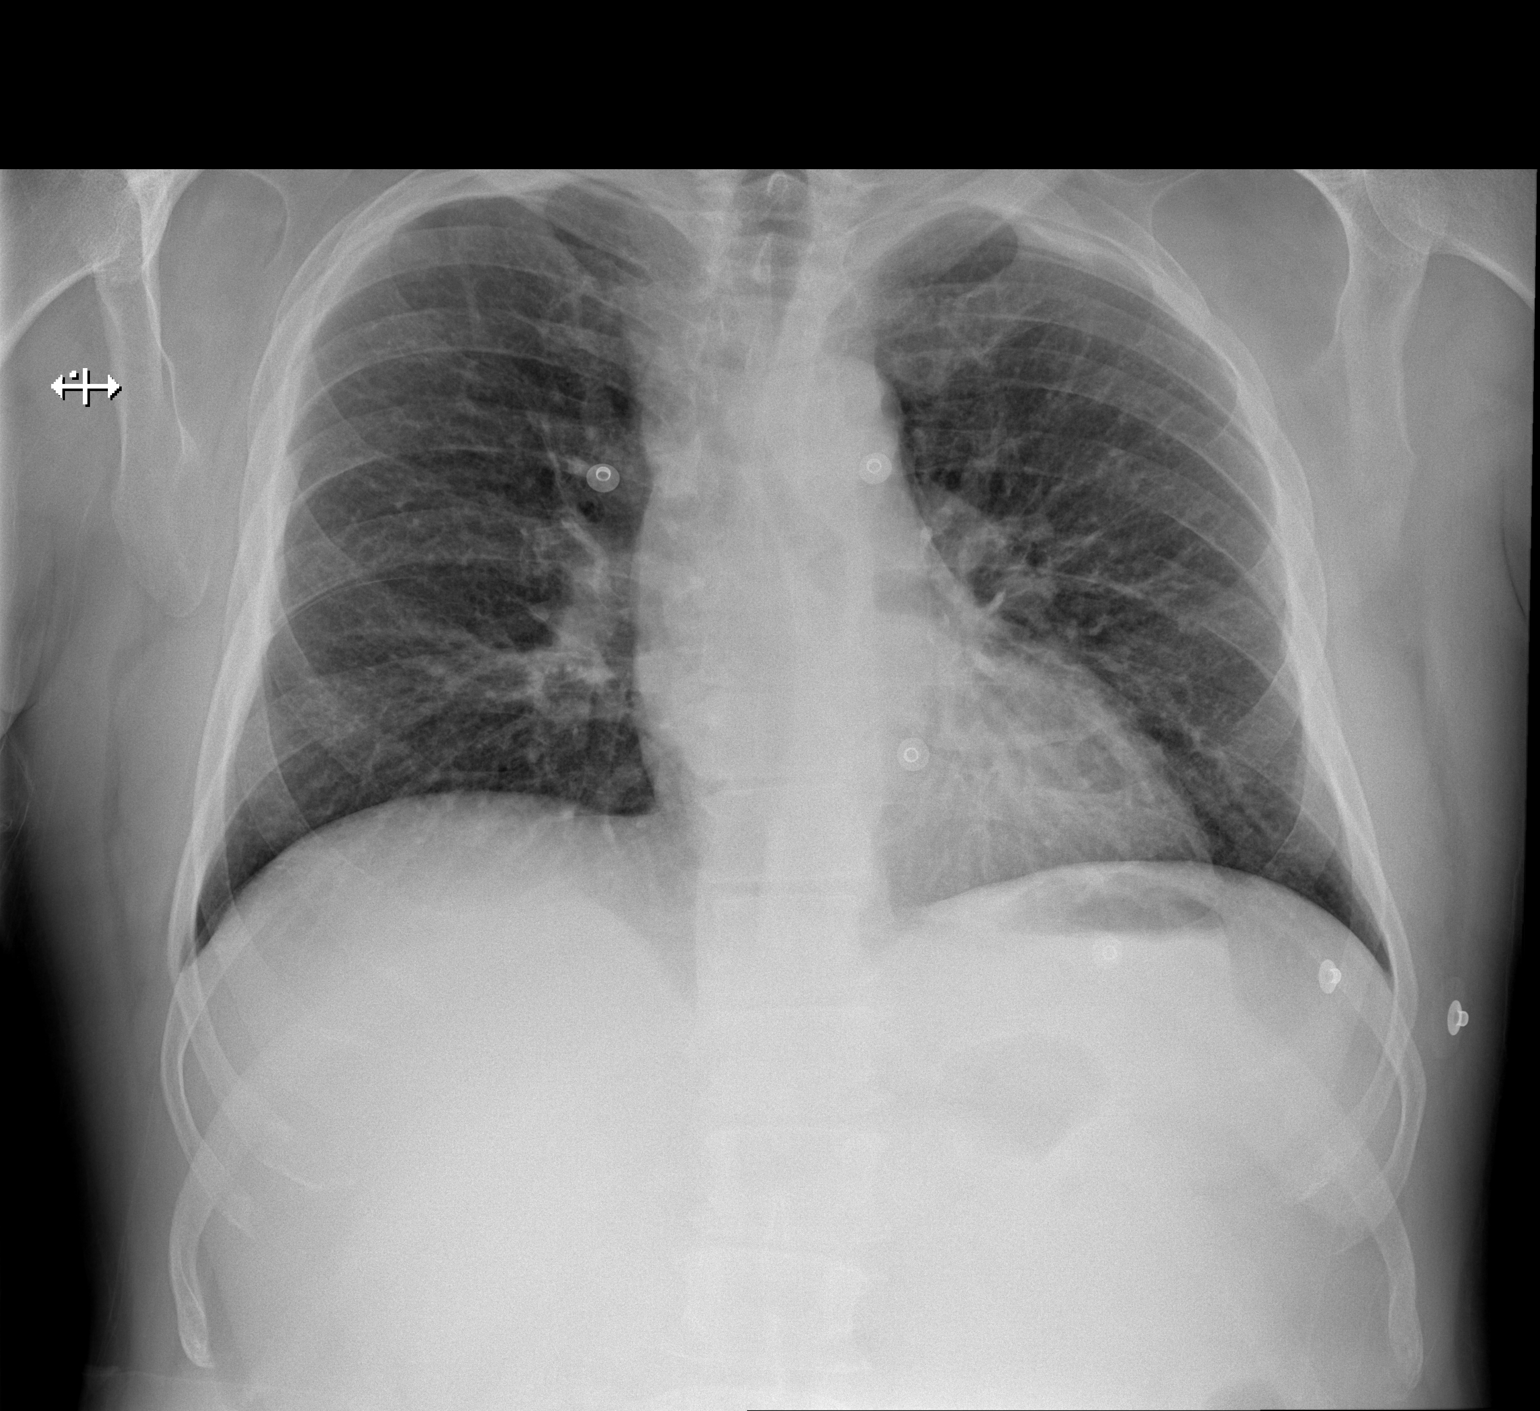

[w chest lat]
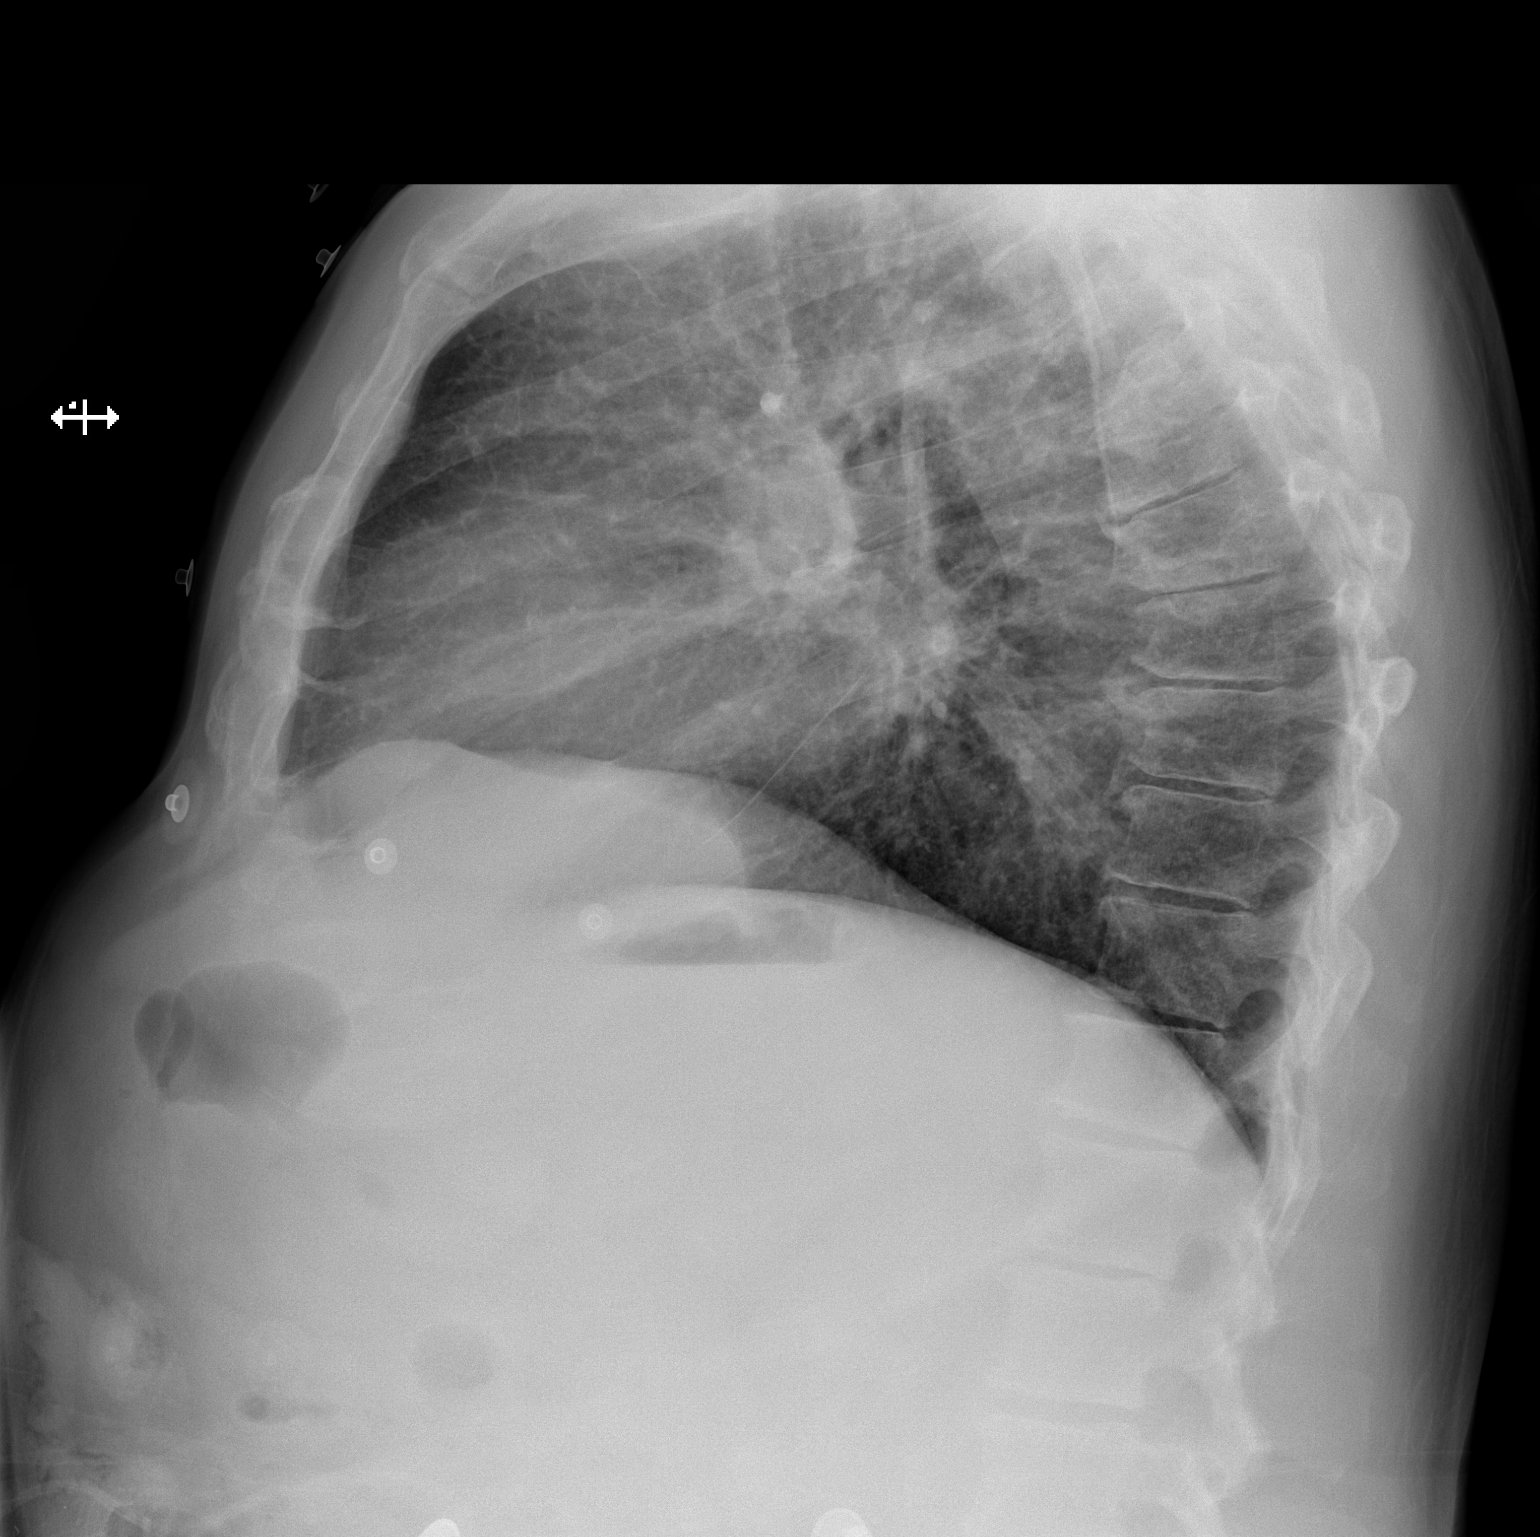

[2 of 2 positions shown; findings below may reference images not displayed]

FINDINGS: The heart size and mediastinal contours are within normal limits.
Both lungs are clear. The visualized skeletal structures are
unremarkable.
IMPRESSION: No active disease.

## 2016-11-03 IMAGING — DX DG ELBOW COMPLETE 3+V*R*
5 series · 5 of 5 positions shown · non-contrast
Comparison: None.

CLINICAL DATA: Fall at work with elbow pain, initial encounter

EXAM:
RIGHT ELBOW - COMPLETE 3+ VIEW

[x elbow ap right]
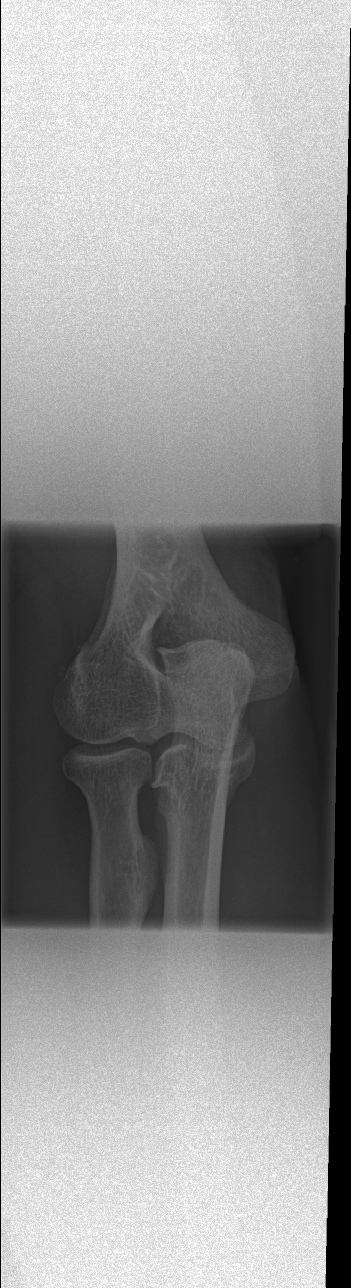

[x elbow obl right (1 of 2)]
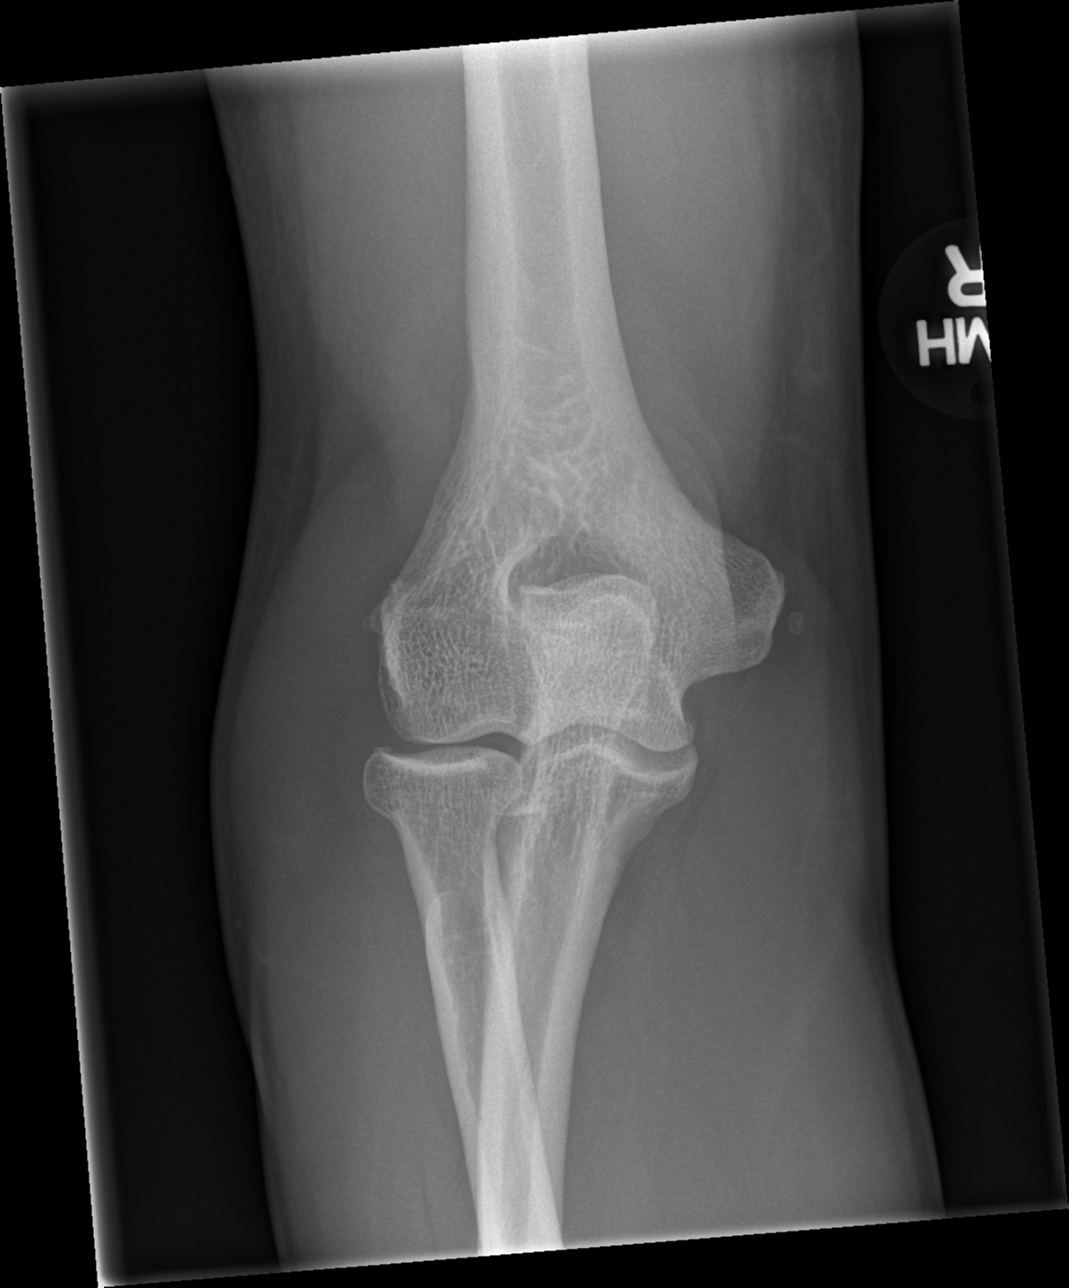

[x elbow obl right (2 of 2)]
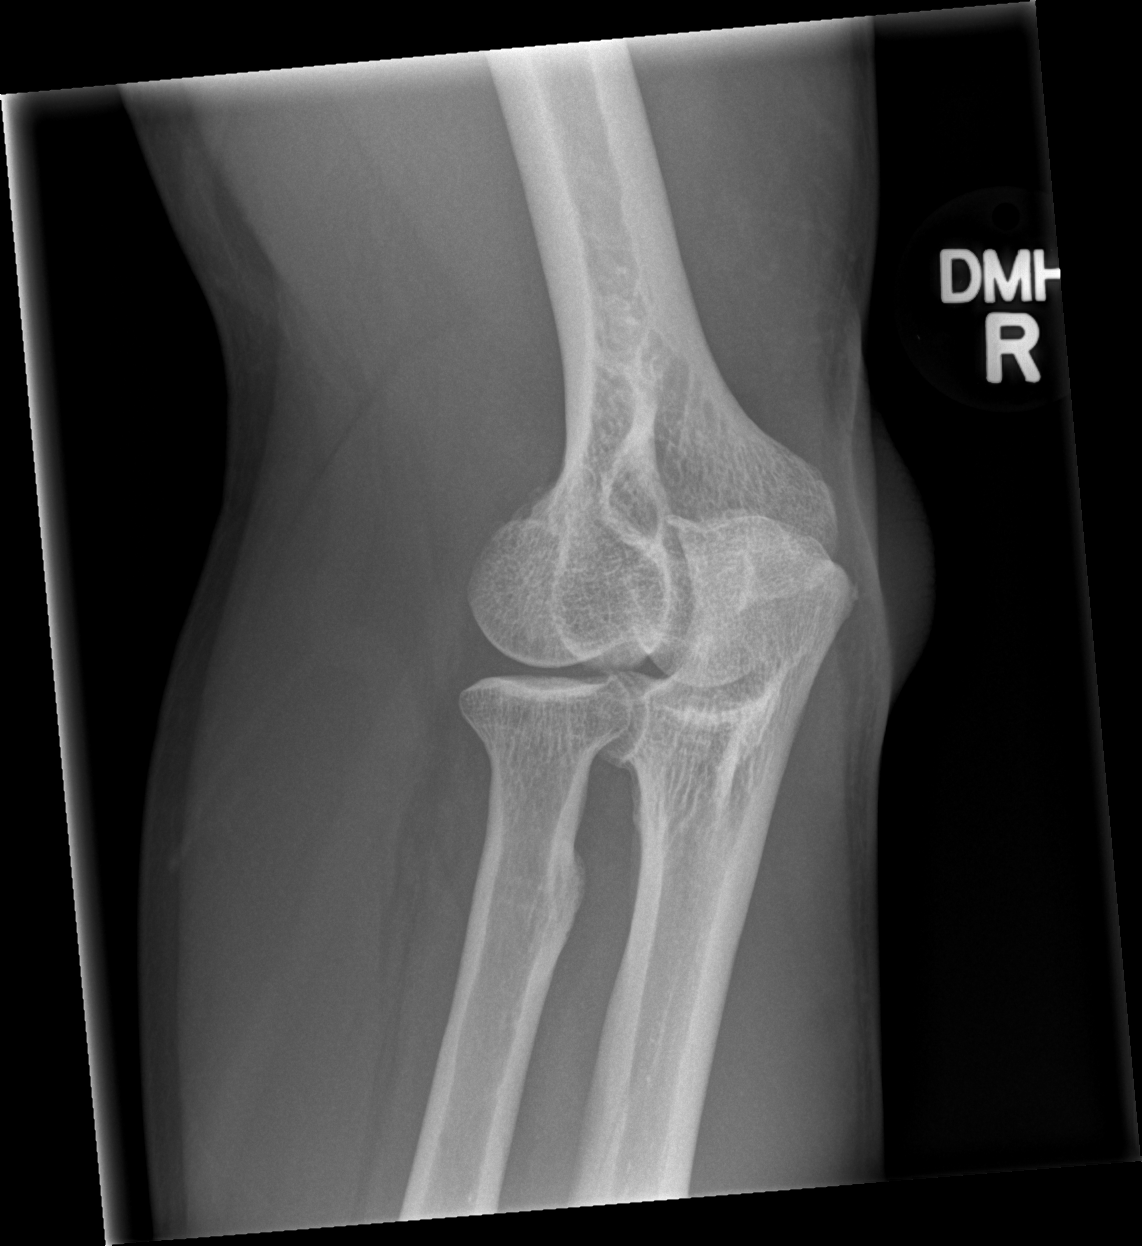

[x elbow lat right (1 of 2)]
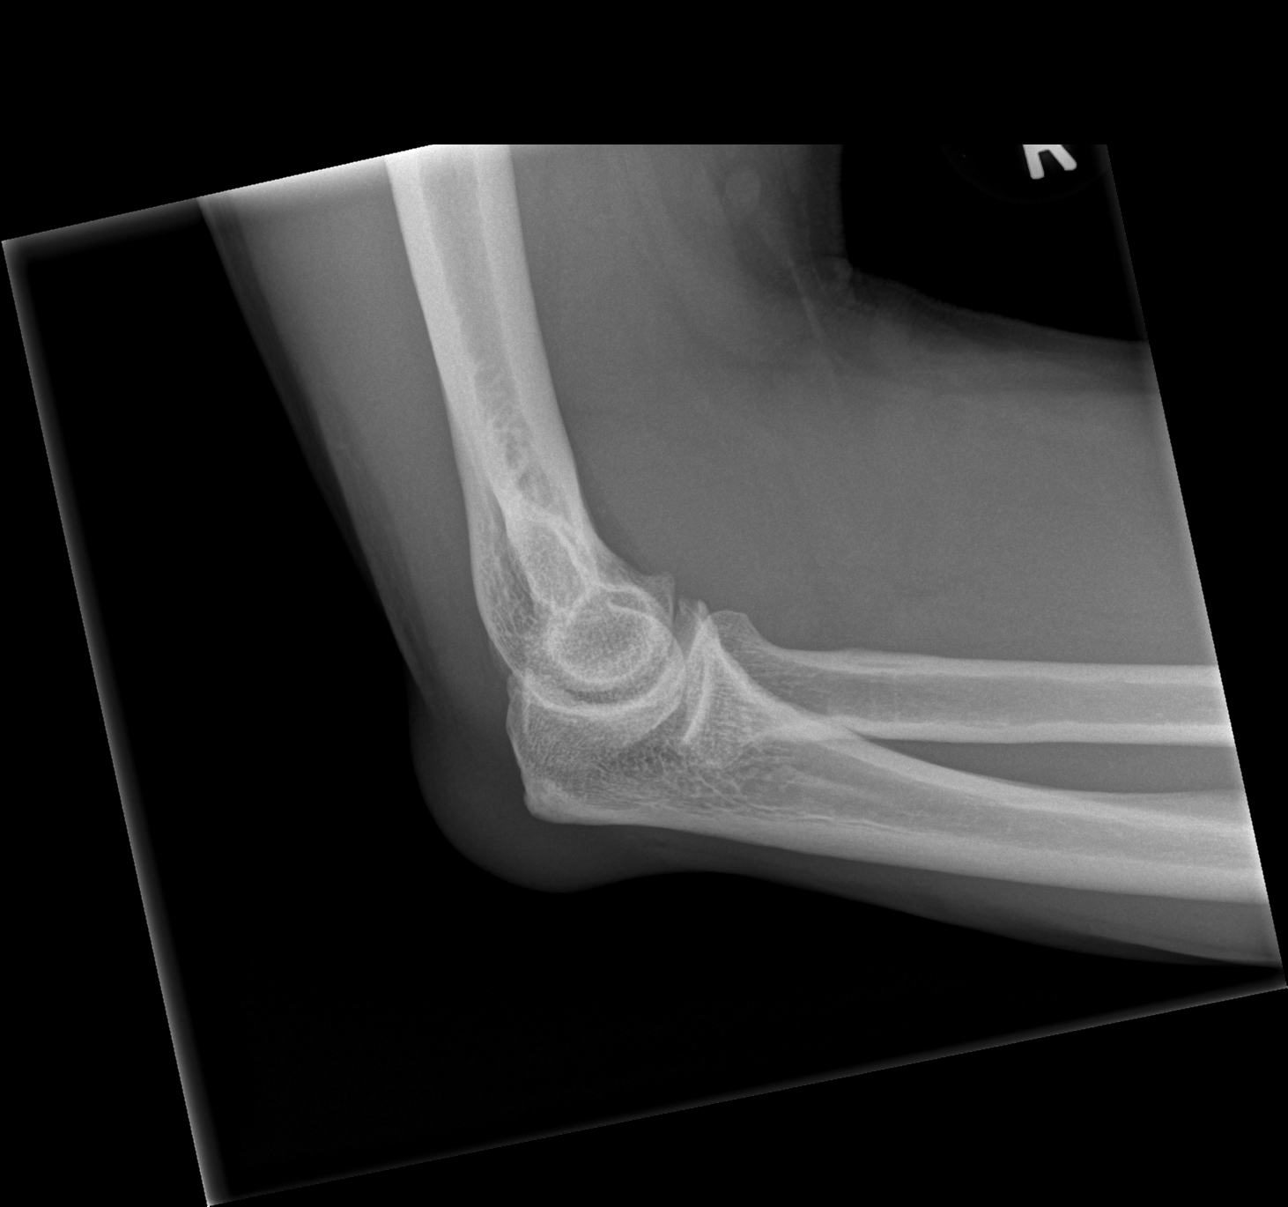

[x elbow lat right (2 of 2)]
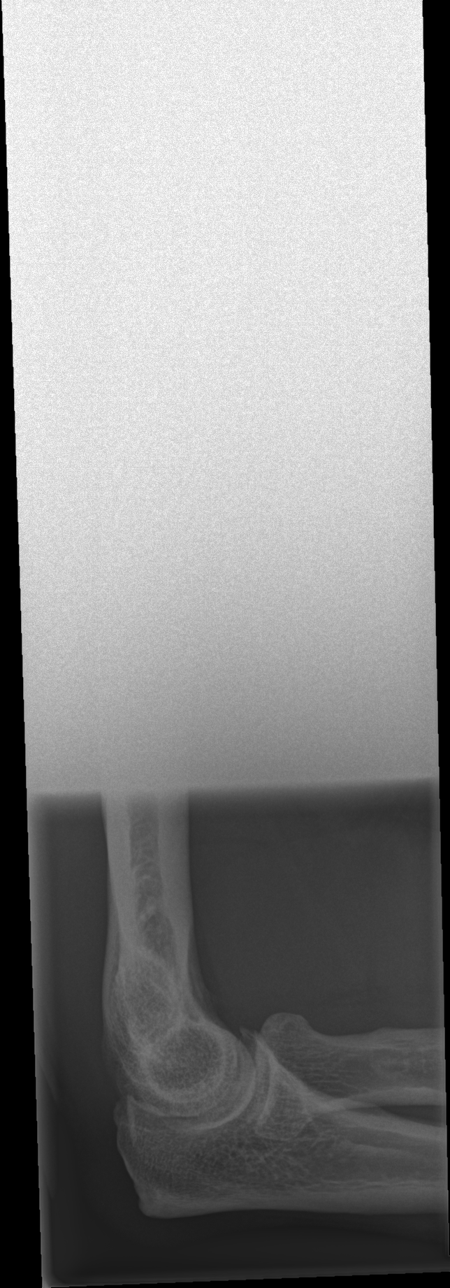

[5 of 5 positions shown; findings below may reference images not displayed]

FINDINGS: Mild degenerative changes are noted in the elbow. No acute fracture
or dislocation is seen. Soft tissue prominence is noted over the
olecranon consistent with the recent injury. No joint effusion is
seen.
IMPRESSION: Mild degenerative change without acute bony abnormality.

Soft tissue swelling over the olecranon consistent with the recent
injury.

## 2023-02-11 DIAGNOSIS — Z9989 Dependence on other enabling machines and devices: Secondary | ICD-10-CM | POA: Diagnosis not present

## 2023-02-11 DIAGNOSIS — E119 Type 2 diabetes mellitus without complications: Secondary | ICD-10-CM | POA: Diagnosis not present

## 2023-02-11 DIAGNOSIS — E7800 Pure hypercholesterolemia, unspecified: Secondary | ICD-10-CM | POA: Diagnosis not present

## 2023-02-11 DIAGNOSIS — I10 Essential (primary) hypertension: Secondary | ICD-10-CM | POA: Diagnosis not present

## 2023-02-11 DIAGNOSIS — E1165 Type 2 diabetes mellitus with hyperglycemia: Secondary | ICD-10-CM | POA: Diagnosis not present

## 2023-02-11 DIAGNOSIS — Z79899 Other long term (current) drug therapy: Secondary | ICD-10-CM | POA: Diagnosis not present

## 2023-05-28 DIAGNOSIS — E7800 Pure hypercholesterolemia, unspecified: Secondary | ICD-10-CM | POA: Diagnosis not present

## 2023-05-28 DIAGNOSIS — Z79899 Other long term (current) drug therapy: Secondary | ICD-10-CM | POA: Diagnosis not present

## 2023-05-28 DIAGNOSIS — I10 Essential (primary) hypertension: Secondary | ICD-10-CM | POA: Diagnosis not present

## 2023-05-28 DIAGNOSIS — E1165 Type 2 diabetes mellitus with hyperglycemia: Secondary | ICD-10-CM | POA: Diagnosis not present

## 2023-09-09 DIAGNOSIS — E7800 Pure hypercholesterolemia, unspecified: Secondary | ICD-10-CM | POA: Diagnosis not present

## 2023-09-09 DIAGNOSIS — I10 Essential (primary) hypertension: Secondary | ICD-10-CM | POA: Diagnosis not present

## 2023-09-09 DIAGNOSIS — E1165 Type 2 diabetes mellitus with hyperglycemia: Secondary | ICD-10-CM | POA: Diagnosis not present

## 2023-09-09 DIAGNOSIS — Z79899 Other long term (current) drug therapy: Secondary | ICD-10-CM | POA: Diagnosis not present

## 2023-10-08 DIAGNOSIS — Z79899 Other long term (current) drug therapy: Secondary | ICD-10-CM | POA: Diagnosis not present

## 2023-12-10 DIAGNOSIS — Z0000 Encounter for general adult medical examination without abnormal findings: Secondary | ICD-10-CM | POA: Diagnosis not present

## 2023-12-10 DIAGNOSIS — I10 Essential (primary) hypertension: Secondary | ICD-10-CM | POA: Diagnosis not present

## 2023-12-10 DIAGNOSIS — E1165 Type 2 diabetes mellitus with hyperglycemia: Secondary | ICD-10-CM | POA: Diagnosis not present

## 2023-12-10 DIAGNOSIS — E7800 Pure hypercholesterolemia, unspecified: Secondary | ICD-10-CM | POA: Diagnosis not present

## 2024-04-13 DIAGNOSIS — E1165 Type 2 diabetes mellitus with hyperglycemia: Secondary | ICD-10-CM | POA: Diagnosis not present

## 2024-04-13 DIAGNOSIS — E7800 Pure hypercholesterolemia, unspecified: Secondary | ICD-10-CM | POA: Diagnosis not present

## 2024-04-13 DIAGNOSIS — I10 Essential (primary) hypertension: Secondary | ICD-10-CM | POA: Diagnosis not present

## 2024-08-14 DIAGNOSIS — E1165 Type 2 diabetes mellitus with hyperglycemia: Secondary | ICD-10-CM | POA: Diagnosis not present

## 2024-08-14 DIAGNOSIS — Z79899 Other long term (current) drug therapy: Secondary | ICD-10-CM | POA: Diagnosis not present
